# Patient Record
Sex: Male | Born: 1975
Health system: Southern US, Community
[De-identification: ages and names within clinical notes are randomized; demographics above are authoritative.]

## PROBLEM LIST (undated history)

## (undated) DIAGNOSIS — B159 Hepatitis A without hepatic coma: Secondary | ICD-10-CM

## (undated) DIAGNOSIS — E785 Hyperlipidemia, unspecified: Secondary | ICD-10-CM

## (undated) DIAGNOSIS — M549 Dorsalgia, unspecified: Secondary | ICD-10-CM

## (undated) HISTORY — DX: Hepatitis a without hepatic coma: B15.9

## (undated) HISTORY — DX: Hyperlipidemia, unspecified: E78.5

## (undated) HISTORY — DX: Dorsalgia, unspecified: M54.9

---
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
## 2008-12-17 ENCOUNTER — Emergency Department (HOSPITAL_COMMUNITY): Admission: EM | Admit: 2008-12-17 | Discharge: 2008-12-17 | Payer: Self-pay | Admitting: Emergency Medicine

## 2016-03-08 ENCOUNTER — Ambulatory Visit: Payer: Self-pay | Admitting: Family Medicine

## 2016-03-23 ENCOUNTER — Ambulatory Visit: Payer: Self-pay | Admitting: Family Medicine

## 2016-03-27 ENCOUNTER — Ambulatory Visit (INDEPENDENT_AMBULATORY_CARE_PROVIDER_SITE_OTHER): Payer: 59 | Admitting: Family Medicine

## 2016-03-27 ENCOUNTER — Encounter: Payer: Self-pay | Admitting: Family Medicine

## 2016-03-27 VITALS — BP 130/90 | HR 94 | Temp 98.1°F | Ht 75.0 in | Wt 225.0 lb

## 2016-03-27 DIAGNOSIS — Z0001 Encounter for general adult medical examination with abnormal findings: Secondary | ICD-10-CM

## 2016-03-27 DIAGNOSIS — R6889 Other general symptoms and signs: Secondary | ICD-10-CM | POA: Diagnosis not present

## 2016-03-27 DIAGNOSIS — M25561 Pain in right knee: Secondary | ICD-10-CM

## 2016-03-27 NOTE — Patient Instructions (Addendum)
We will call you within a week about your referral to Terrilee FilesZach [facility] of sports medicine. If you do not hear within 2 weeks, give us a call.   Keep an eye on blood pressure. Obviously goal <140/90. In office was 130/90. Weight loss should help- glad you are working on that.   Please send us a copy of labs from doctor day and if you can get a copy of immunizations from [facility] we can put those in the computer

## 2016-03-27 NOTE — Progress Notes (Signed)
Stephen Hunter, MD Phone: [phone]  Subjective:  Patient presents today tTana Jackson establish care. Chief complaint-noted.   See problem oriented charting  The following were reviewed and entered/updated in epic: Past Medical History  Diagnosis Date  . Hyperlipidemia     LDL low 100s  . Hepatitis A     age 89  . Back pain     Back pain about once a year- easy for a week or two then goes away. Since HS.    There are no active problems to display for this patient.  Past Surgical History  Procedure Laterality Date  . Appendectomy  1990    Family History  Problem Relation Age of Onset  . Prostate cancer Father     5572 treated radiation gleason 3+3  . Hyperlipidemia Father     no CAD in family  . Hypertension Mother   . Hypertension Mother   . Colon cancer Maternal Grandfather     age 991    Medications- reviewed and updated No current outpatient prescriptions on file.   No current facility-administered medications for this visit.    Allergies-reviewed and updated No Known Allergies  Social History   Social History  . Marital Status: Married    Spouse Name: N/A  . Number of Children: N/A  . Years of Education: N/A   Social History Main Topics  . Smoking status: Former Smoker -- 1.00 packs/day for 7 years    Types: Cigarettes    Quit date: 12/10/2004  . Smokeless tobacco: Not on file  . Alcohol Use: 1.2 - 1.8 oz/week    2-3 Standard drinks or equivalent per week  . Drug Use: No  . Sexual Activity: Not on file   Other Topics Concern  . Not on file   Social History Narrative   Family: Married to Jacobs EngineeringCristina Jackson. 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     ROS--Full ROS was completed Review of Systems  Constitutional: Negative for fever and chills.  HENT: Negative for hearing loss.   Eyes: Negative for blurred vision and double vision.  Respiratory: Negative for cough and hemoptysis.     Cardiovascular: Negative for chest pain and palpitations.  Gastrointestinal: Negative for heartburn and nausea.  Genitourinary: Negative for dysuria and urgency.  Musculoskeletal: Positive for back pain (at times) and joint pain (R knee).  Skin: Negative for itching and rash.  Neurological: Negative for dizziness, tingling and headaches.  Endo/Heme/Allergies: Negative for polydipsia. Does not bruise/bleed easily.  Psychiatric/Behavioral: Negative for hallucinations and substance abuse.  Back pain about once a year- easy for a week or two then goes away. Since HS.   Objective: BP 140/86 mmHg  Pulse 94  Temp(Src) 98.1 F (36.7 C)  Ht 6\' 3"  (1.905 m)  Wt 225 lb (102.059 kg)  BMI 28.12 kg/m2 Gen: NAD, resting comfortably HEENT: Mucous membranes are moist. Oropharynx normal. TM normal. Eyes: sclera and lids normal, PERRLA Neck: no thyromegaly, no cervical lymphadenopathy CV: RRR no murmurs rubs or gallops Lungs: CTAB no crackles, wheeze, rhonchi Abdomen: soft/nontender/nondistended/normal bowel sounds. No rebound or guarding.  Ext: no edema Skin: warm, dry Neuro: 5/5 strength in upper and lower extremities, normal gait, normal reflexes  Right Knee: Normal to inspection with no erythema or effusion or obvious bony abnormalities. Palpation normal with no warmth or joint line tenderness or patellar tenderness or condyle tenderness. ROM normal in flexion and extension  and lower leg rotation. Ligaments with solid consistent endpoints including ACL, PCL, LCL, MCL. Negative Mcmurray's - could produce pop at times without pain though Non painful patellar compression. Patellar and quadriceps tendons unremarkable. Hamstring and quadriceps strength is normal.   Assessment/Plan:  40 y.o. male presenting for annual physical.  Health Maintenance counseling: 1. Anticipatory guidance: Patient counseled regarding regular dental exams, eye exams, wearing seatbelts.  2. Risk factor reduction:   Advised patient of need for regular exercise and diet rich and fruits and vegetables to reduce risk of heart attack and stroke. Targeting weight of 200 3. Immunizations/screenings/ancillary studies- will get records from [facility] 4. Prostate cancer screening- father age 48, will start patient age 61  5. Colon cancer screening - start age 35, grandfather over 60 with history  Will drop off labs done at hospital on May 2nd for me to review  Will also monitor BP- slightly high diastolic  Right knee pain - Plan: Ambulatory referral to Sports Medicine For about 6 months has noted mild discomfort at times in right knee when goes to bed at night. Worse after he runs 4-5 hours. Usually runs on treadmill but enjoys being outdoors more. Knows he needs to lose weight to reduce stress. He has been as high as 255 but as low as 200 (from 2006-2010). Hears knee popping. He knows he likely has some underlying arthritis or damage- wants to discuss preventative measures with sports medicine to prevent progression to worsening issues. He is aware how weight loss is needed   Return in about 1 year (around 03/27/2017) for physical. Return precautions advised.   Orders Placed This Encounter  Procedures  . Ambulatory referral to Sports Medicine    Referral Priority:  Routine    Referral Type:  Consultation    Number of Visits Requested:  1

## 2016-04-10 ENCOUNTER — Encounter: Payer: Self-pay | Admitting: Family Medicine

## 2016-04-10 ENCOUNTER — Ambulatory Visit (INDEPENDENT_AMBULATORY_CARE_PROVIDER_SITE_OTHER): Payer: 59 | Admitting: Family Medicine

## 2016-04-10 ENCOUNTER — Other Ambulatory Visit: Payer: Self-pay

## 2016-04-10 VITALS — BP 118/82 | HR 75 | Ht 75.0 in | Wt 222.0 lb

## 2016-04-10 DIAGNOSIS — M25561 Pain in right knee: Secondary | ICD-10-CM

## 2016-04-10 DIAGNOSIS — M222X1 Patellofemoral disorders, right knee: Secondary | ICD-10-CM

## 2016-04-10 NOTE — Assessment & Plan Note (Signed)
Patient does have some patellofemoral syndrome. Muscle imbalances with weakness of the vastus medialis oblique. We discussed home exercises and patient did work with Event organiserathletic trainer today. We discussed icing regimen. We discussed which activities to do in which ones to avoid. We discussed the importance crosstraining. Patient will continue to monitor her shoes. Patient will try topical anti-inflammatories. We discussed which activities to do in which ones to avoid. Patient will follow-up with me again in 4 weeks for further evaluation and treatment.

## 2016-04-10 NOTE — Progress Notes (Signed)
Pre visit review using our clinic review tool, if applicable. No additional management support is needed unless otherwise documented below in the visit note. 

## 2016-04-10 NOTE — Progress Notes (Signed)
Tawana ScaleZach Smith D.O. [facility] [address] [place], [place] [address] Phone: [phone] Subjective:    I'm seeing this patient by the request  of:  Tana ConchStephen Hunter, MD  CC: Right knee pain  OZH:[id number]:Subjective James Otelia SergeantM Dulany is a 40 y.o. male coming in with complaint of right knee pain. Patient is had this pain for proximally 6 months. Seems to be worse when he goes to sleep at night. Patient is an avid runner and has noticed that it seems to be hurting him somewhat when he is running. Sometimes hears knee popping.Patient states that it is ever giving out on him or any instability. Denies any radiation down the leg or any numbness. States that he states really localize when it does occur. Not stopping him from any activities at this time and continues to be able to run miles at a time when needed. Has not tried any over-the-counter medications.     Past Medical History  Diagnosis Date  . Hyperlipidemia     LDL low 100s  . Hepatitis A     age 89  . Back pain     Back pain about once a year- easy for a week or two then goes away. Since HS.    Past Surgical History  Procedure Laterality Date  . Appendectomy  1990   Social History   Social History  . Marital Status: Married    Spouse Name: N/A  . Number of Children: N/A  . Years of Education: N/A   Social History Main Topics  . Smoking status: Former Smoker -- 1.00 packs/day for 7 years    Types: Cigarettes    Quit date: 12/10/2004  . Smokeless tobacco: None  . Alcohol Use: 1.2 - 1.8 oz/week    2-3 Standard drinks or equivalent per week  . Drug Use: No  . Sexual Activity: Not Asked   Other Topics Concern  . None   Social History Narrative   Family: Married to Jacobs EngineeringCristina Maynor. 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    No Known Allergies Family History  Problem Relation Age of Onset  . Prostate cancer Father     6772 treated radiation gleason  3+3  . Hyperlipidemia Father     no CAD in family  . Hypertension Mother   . Hypertension Mother   . Colon cancer Maternal Grandfather     age 41    Past medical history, social, surgical and family history all reviewed in electronic medical record.  No pertanent information unless stated regarding to the chief complaint.   Review of Systems: No headache, visual changes, nausea, vomiting, diarrhea, constipation, dizziness, abdominal pain, skin rash, fevers, chills, night sweats, weight loss, swollen lymph nodes, body aches, joint swelling, muscle aches, chest pain, shortness of breath, mood changes.   Objective Blood pressure 118/82, pulse 75, height 6\' 3"  (1.905 m), weight 222 lb (100.699 kg), SpO2 99 %.  General: No apparent distress alert and oriented x3 mood and affect normal, dressed appropriately.  HEENT: Pupils equal, extraocular movements intact  Respiratory: Patient's speak in full sentences and does not appear short of breath  Cardiovascular: No lower extremity edema, non tender, no erythema  Skin: Warm dry intact with no signs of infection or rash on extremities or on axial skeleton.  Abdomen: Soft nontender  Neuro: Cranial nerves II through XII are intact, neurovascularly intact in all extremities  with 2+ DTRs and 2+ pulses.  Lymph: No lymphadenopathy of posterior or anterior cervical chain or axillae bilaterally.  Gait normal with good balance and coordination.  MSK:  Non tender with full range of motion and good stability and symmetric strength and tone of shoulders, elbows, wrist, hip, and ankles bilaterally.  Knee:Right  normal to inspection with no erythema or effusion or obvious bony abnormalities. Palpation normal with no warmth, joint line tenderness, patellar tenderness, or condyle tenderness. ROM full in flexion and extension and lower leg rotation.Mild lateral tracking of the knee Compared to the contralateral side Ligaments with solid consistent endpoints  including ACL, PCL, LCL, MCL. Negative Mcmurray's, Apley's, and Thessalonian tests. Non painful patellar compression. Patellar glide with mild crepitus. Patellar and quadriceps tendons unremarkable. Hamstring and quadriceps strength is normal.  Contralateral knee unremarkable  MSK US performed of: Right knee This study was ordered, performed, and interpreted by Terrilee Files D.O.  Knee: All structures visualized. Anteromedial, anterolateral, posteromedial, and posterolateral menisci unremarkable without tearing, fraying, effusion, or displacement. Patellar Tendon unremarkable on long and transverse views without effusion.Very mild narrowing of the patellofemoral joint laterally No abnormality of prepatellar bursa. LCL and MCL unremarkable on long and transverse views. No abnormality of origin of medial or lateral head of the gastrocnemius.  IMPRESSION:  Minimal patellofemoral syndrome  Procedure note 97110; 15 minutes spent for Therapeutic exercises as stated in above notes.  This included exercises focusing on stretching, strengthening, with significant focus on eccentric aspects.  Vastus medialis oblique strengthening as well as eccentric exercises.  Conditioning to help with stabilization. Proper technique shown and discussed handout in great detail with ATC.  All questions were discussed and answered.     Impression and Recommendations:     This case required medical decision making of moderate complexity.      Note: This dictation was prepared with Dragon dictation along with smaller phrase technology. Any transcriptional errors that result from this process are unintentional.        l

## 2016-04-10 NOTE — Patient Instructions (Signed)
Good to meet you  Ice 20 minutes 2 times daily. Usually after activity and before bed. Exercises 3 times a week.  I would like you to cross train a bit and consider elliptical or biking instead of always running I would like you to add some leg weight lifting one time a week as well.  pennsaid pinkie amount topically 2 times daily as needed.  Vitamin D 2000 IU daily  Turmeric 500mg  1-2 times daily can help with inflammation  See me again in 4 weeks if not better or worse or send me a message in my chart  Keep up the weight loss, you ar doing great ..Marland Kitchen

## 2016-04-11 LAB — LIPID PANEL
Cholesterol: 205 mg/dL — AB (ref 0–200)
HDL: 38 mg/dL (ref 35–70)
LDL Cholesterol: 142 mg/dL
Triglycerides: 123 mg/dL (ref 40–160)

## 2016-04-11 LAB — BASIC METABOLIC PANEL
BUN: 9 mg/dL (ref 4–21)
Creatinine: 1 mg/dL (ref 0.6–1.3)
Glucose: 100 mg/dL
Potassium: 4.4 mmol/L (ref 3.4–5.3)
Sodium: 140 mmol/L (ref 137–147)

## 2016-04-11 LAB — HEPATIC FUNCTION PANEL
ALT: 19 U/L (ref 10–40)
AST: 24 U/L (ref 14–40)
Alkaline Phosphatase: 49 U/L (ref 25–125)
Bilirubin, Total: 0.6 mg/dL

## 2016-04-11 LAB — CBC AND DIFFERENTIAL
HCT: 42 % (ref 41–53)
Hemoglobin: 13.8 g/dL (ref 13.5–17.5)
Platelets: 228 10*3/uL (ref 150–399)
WBC: 6.2 10^3/mL

## 2016-04-11 LAB — PSA: PSA: 0.58

## 2016-04-11 LAB — TSH: TSH: 1.62 u[IU]/mL (ref 0.41–5.90)

## 2016-04-20 ENCOUNTER — Encounter: Payer: Self-pay | Admitting: Family Medicine

## 2016-04-20 ENCOUNTER — Other Ambulatory Visit: Payer: Self-pay | Admitting: Family Medicine

## 2016-05-08 ENCOUNTER — Encounter: Payer: Self-pay | Admitting: Family Medicine

## 2016-05-08 ENCOUNTER — Ambulatory Visit (INDEPENDENT_AMBULATORY_CARE_PROVIDER_SITE_OTHER): Payer: 59 | Admitting: Family Medicine

## 2016-05-08 VITALS — BP 122/82 | HR 62 | Ht 75.0 in | Wt 216.0 lb

## 2016-05-08 DIAGNOSIS — M222X1 Patellofemoral disorders, right knee: Secondary | ICD-10-CM

## 2016-05-08 NOTE — Assessment & Plan Note (Signed)
Doing significantly better at this time. We discussed icing regimen and home exercises. Patient will continue the conservative therapy and see me again in 6 weeks.

## 2016-05-08 NOTE — Progress Notes (Signed)
[facility] [address] [place], [place] [address] Phone: [phone] Subjective:    I'm seeing this patient by the request  of:  Tana Conch, MD  CC: Right knee pain f/u  QIH:[id number] Nolen Manolo Bosket is a 40 y.o. male coming in with complaint of right knee pain. Patient was sent have patellofemoral syndrome. Patient has continued to run and in doing the home exercises. Patient has been doing the icing. Did a trial of topical anti-inflammatories. Overall would state that he is about 90% better. States that the popping is a lot less. Still when he runs gives him some discomfort. Not icing on a regular basis. No nighttime awakenings.     Past Medical History  Diagnosis Date  . Hyperlipidemia     LDL low 100s  . Hepatitis A     age 35  . Back pain     Back pain about once a year- easy for a week or two then goes away. Since HS.    Past Surgical History  Procedure Laterality Date  . Appendectomy  1990   Social History   Social History  . Marital Status: Married    Spouse Name: N/A  . Number of Children: N/A  . Years of Education: N/A   Social History Main Topics  . Smoking status: Former Smoker -- 1.00 packs/day for 7 years    Types: Cigarettes    Quit date: 12/10/2004  . Smokeless tobacco: None  . Alcohol Use: 1.2 - 1.8 oz/week    2-3 Standard drinks or equivalent per week  . Drug Use: No  . Sexual Activity: Not Asked   Other Topics Concern  . None   Social History Narrative   Family: Married to [employer]. 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    No Known Allergies Family History  Problem Relation Age of Onset  . Prostate cancer Father     21 treated radiation gleason 3+3  . Hyperlipidemia Father     no CAD in family  . Hypertension Mother   . Hypertension Mother   . Colon cancer Maternal Grandfather     age 26    Past medical history,  social, surgical and family history all reviewed in electronic medical record.  No pertanent information unless stated regarding to the chief complaint.   Review of Systems: No headache, visual changes, nausea, vomiting, diarrhea, constipation, dizziness, abdominal pain, skin rash, fevers, chills, night sweats, weight loss, swollen lymph nodes, body aches, joint swelling, muscle aches, chest pain, shortness of breath, mood changes.   Objective Blood pressure 122/82, pulse 62, height  (1.905 m), weight 216 lb (97.977 kg), SpO2 99 %.  General: No apparent distress alert and oriented x3 mood and affect normal, dressed appropriately.  HEENT: Pupils equal, extraocular movements intact  Respiratory: Patient's speak in full sentences and does not appear short of breath  Cardiovascular: No lower extremity edema, non tender, no erythema  Skin: Warm dry intact with no signs of infection or rash on extremities or on axial skeleton.  Abdomen: Soft nontender  Neuro: Cranial nerves II through XII are intact, neurovascularly intact in all extremities with 2+ DTRs and 2+ pulses.  Lymph: No lymphadenopathy of posterior or anterior cervical chain or axillae bilaterally.  Gait normal with good balance and coordination.  MSK:  Non tender with full range of motion and good  stability and symmetric strength and tone of shoulders, elbows, wrist, hip, and ankles bilaterally.  Knee:Right  normal to inspection with no erythema or effusion or obvious bony abnormalities. Palpation normal with no warmth, joint line tenderness, patellar tenderness, or condyle tenderness. ROM full in flexion and extension and lower leg rotation.Mild improvement in lateral tracking still existed Ligaments with solid consistent endpoints including ACL, PCL, LCL, MCL. Negative Mcmurray's, Apley's, and Thessalonian tests. Non painful patellar compression. Patellar glide with no crepitus. Patellar and quadriceps tendons  unremarkable. Hamstring and quadriceps strength is normal.  Contralateral knee unremarkable       Impression and Recommendations:     This case required medical decision making of moderate complexity.      Note: This dictation was prepared with Dragon dictation along with smaller phrase technology. Any transcriptional errors that result from this process are unintentional.        l

## 2016-05-08 NOTE — Progress Notes (Signed)
Pre visit review using our clinic review tool, if applicable. No additional management support is needed unless otherwise documented below in the visit note. 

## 2017-04-04 ENCOUNTER — Encounter: Payer: Self-pay | Admitting: Family Medicine

## 2017-04-04 ENCOUNTER — Ambulatory Visit (INDEPENDENT_AMBULATORY_CARE_PROVIDER_SITE_OTHER): Payer: 59 | Admitting: Family Medicine

## 2017-04-04 VITALS — BP 138/88 | HR 89 | Temp 98.3°F | Ht 74.5 in | Wt 213.8 lb

## 2017-04-04 DIAGNOSIS — Z789 Other specified health status: Secondary | ICD-10-CM

## 2017-04-04 DIAGNOSIS — E7800 Pure hypercholesterolemia, unspecified: Secondary | ICD-10-CM

## 2017-04-04 DIAGNOSIS — Z0000 Encounter for general adult medical examination without abnormal findings: Secondary | ICD-10-CM

## 2017-04-04 DIAGNOSIS — R140 Abdominal distension (gaseous): Secondary | ICD-10-CM | POA: Diagnosis not present

## 2017-04-04 DIAGNOSIS — F40243 Fear of flying: Secondary | ICD-10-CM | POA: Diagnosis not present

## 2017-04-04 MED ORDER — LORAZEPAM 0.5 MG PO TABS: 0.5000 mg | ORAL_TABLET | Freq: Every day | ORAL | 0 refills | Status: DC | PRN

## 2017-04-04 NOTE — Assessment & Plan Note (Signed)
Hypnotherapy for flying but still gets anxious. [place] in June for 6 weeks. Ativan 0.5 mg written #20- precautions per avs

## 2017-04-04 NOTE — Progress Notes (Signed)
Pre visit review using our clinic review tool, if applicable. No additional management support is needed unless otherwise documented below in the visit note. 

## 2017-04-04 NOTE — Assessment & Plan Note (Addendum)
S: some issues since he was a kid. Some urgency if he was hiking. Switched to a plant based diet last November- last meat around the holidays. A lot of bloating since that time. Not having constipation or diarrhea with it. No abdominal cramping. With meat seems to resolve. Probiotics not helped. Raw vegan - symptoms resolved with this. Gluten free did not help. Worse during work week.  Symptom management- simethicone or imodium.  A/P: we reviewed FODMAP foods and potential irritants- unfortunately many of these needed for his plant base diet. May trial off groups individually to see if makes difference. Does not meet IBS criteria without abdominal pain/cramping and really diarrhea/constipation not an issue.

## 2017-04-04 NOTE — Progress Notes (Signed)
Phone: [phone]  Subjective:  Patient presents today for their annual physical. Chief complaint-noted.   See problem oriented charting- ROS- full  review of systems was completed and negative except for abdominal bloating- usually at work. No blood in stool or melena or constipation or diarrhea or vomiting  The following were reviewed and entered/updated in epic: Past Medical History:  Diagnosis Date  . Back pain    Back pain about once a year- easy for a week or two then goes away. Since HS.   Marland Kitchen Hepatitis A    age 52  . Hyperlipidemia    LDL low 100s   Patient Active Problem List   Diagnosis Date Noted  . Anxiety with flying 04/04/2017  . Bloating 04/04/2017  . Patellofemoral stress syndrome of right knee 04/10/2016   Past Surgical History:  Procedure Laterality Date  . APPENDECTOMY  1990   Family History  Problem Relation Age of Onset  . Prostate cancer Father     33 treated radiation gleason 3+3  . Hyperlipidemia Father     no CAD in family  . Hypertension Mother   . Colon cancer Maternal Grandfather     age 58    Medications- reviewed and updated Current Outpatient Prescriptions  Medication Sig Dispense Refill  . LORazepam (ATIVAN) 0.5 MG tablet Take 1-2 tablets (0.5-1 mg total) by mouth daily as needed for anxiety (for flights to [place]). 20 tablet 0   No current facility-administered medications for this visit.     Allergies-reviewed and updated No Known Allergies  Social History   Social History  . Marital status: Married    Spouse name: N/A  . Number of children: N/A  . Years of education: N/A   Social History Main Topics  . Smoking status: Former Smoker    Packs/day: 1.00    Years: 7.00    Types: Cigarettes    Quit date: 12/10/2004  . Smokeless tobacco: Never Used  . Alcohol use 1.2 - 1.8 oz/week    2 - 3 Standard drinks or equivalent per week  . Drug use: No  . Sexual activity: Yes   Other Topics Concern  . None   Social History  Narrative   Family: Married to [employer]. 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     Objective: BP 138/88 (BP Location: Left Arm, Patient Position: Sitting, Cuff Size: Large)   Pulse 89   Temp 98.3 F (36.8 C) (Oral)   Ht 6' 2.5" (1.892 m)   Wt 213 lb 12.8 oz (97 kg)   SpO2 97%   BMI 27.08 kg/m  Gen: NAD, resting comfortably HEENT: Mucous membranes are moist. Oropharynx normal Neck: no thyromegaly CV: RRR no murmurs rubs or gallops Lungs: CTAB no crackles, wheeze, rhonchi Abdomen: soft/nontender/nondistended/normal bowel sounds. No rebound or guarding.  Ext: no edema Skin: warm, dry Neuro: grossly normal, moves all extremities, PERRLA  Assessment/Plan:  41 y.o. male presenting for annual physical.  Health Maintenance counseling: 1. Anticipatory guidance: Patient counseled regarding regular dental exams q4 months, eye exams -started with readers, wearing seatbelts.  2. Risk factor reduction:  Advised patient of need for regular exercise and diet rich and fruits and vegetables to reduce risk of heart attack and stroke. Exercise- as able on off weeks. Diet- moving toward weight goal of 200 with plant based diet.  Wt Readings from Last 3 Encounters:  04/04/17 213 lb  12.8 oz (97 kg)  05/08/16 216 lb (98 kg)  04/10/16 222 lb (100.7 kg)  3. Immunizations/screenings/ancillary studies- have not received HR records yet. States due for Tdap but he states had it.  offered HIV screen as well- had in residency  4. Prostate cancer screening- still start at age 65 . Father with prostate cancer age 44 Lab Results  Component Value Date   PSA 0.58 04/11/2016   5. Colon cancer screening - start age 82- colon cancer in grandfather at age 50 6. Skin cancer screening/prevention- no concerns, uses long sleeves 7. Testicular cancer screening- advised monthly self exams  8. STD screening- patient opts out  Status of chronic  or acute concerns   Preventative health care - Plan: CBC, Comprehensive metabolic panel, Lipid panel, TSH, Vitamin B12, VITAMIN D 25 Hydroxy (Vit-D Deficiency, Fractures) Pure hypercholesterolemia - Plan: CBC, Comprehensive metabolic panel, Lipid panel, TSH Vegetarian diet - Plan: Vitamin B12, VITAMIN D 25 Hydroxy (Vit-D Deficiency, Fractures)  Declines PSA for now- likely start at 49 unless gets lab done on doctor day  BP at home 125/78 this AM. Diastolic runs high in office.   Anxiety with flying Hypnotherapy for flying but still gets anxious. [place] in June for 6 weeks. Ativan 0.5 mg written #20- precautions per avs  Bloating S: some issues since he was a kid. Some urgency if he was hiking. Switched to a plant based diet last November- last meat around the holidays. A lot of bloating since that time. Not having constipation or diarrhea with it. No abdominal cramping. With meat seems to resolve. Probiotics not helped. Raw vegan - symptoms resolved with this. Gluten free did not help. Worse during work week.  Symptom management- simethicone or imodium.  A/P: we reviewed FODMAP foods and potential irritants- unfortunately many of these needed for his plant base diet. May trial off groups individually to see if makes difference. Does not meet IBS criteria without abdominal pain/cramping and really diarrhea/constipation not an issue.   1 year CPE  Future fasting labs Orders Placed This Encounter  Procedures  . CBC    Standing Status:   Future    Standing Expiration Date:   04/04/2018  . Comprehensive metabolic panel    [place]    Standing Status:   Future    Standing Expiration Date:   04/04/2018  . Lipid panel    Standing Status:   Future    Standing Expiration Date:   04/04/2018  . TSH    Standing Status:   Future    Standing Expiration Date:   04/04/2018  . Vitamin B12    Standing Status:   Future    Standing Expiration Date:   04/04/2018  . VITAMIN D 25 Hydroxy (Vit-D Deficiency,  Fractures)    Standing Status:   Future    Standing Expiration Date:   04/04/2018    Meds ordered this encounter  Medications  . LORazepam (ATIVAN) 0.5 MG tablet    Sig: Take 1-2 tablets (0.5-1 mg total) by mouth daily as needed for anxiety (for flights to [place]).    Dispense:  20 tablet    Refill:  0   Return precautions advised.  Tana Conch, MD

## 2017-04-04 NOTE — Patient Instructions (Addendum)
Send me a copy of prior immunizations.   Schedule a lab visit at the check out desk within 2 weeks. Return for future fasting labs meaning nothing but water after midnight please. Ok to take your medications with water.   Ativan given for long plane flights. You are already aware not to use with alcohol and would also advise trial before your trip in evening to make sure you tolerate. Obviously dont drive for 8 hours after taking.   Please make sure Silvestre Mesi knows it was ultimately your decision not mine to not do the CRP!

## 2017-04-18 ENCOUNTER — Other Ambulatory Visit (INDEPENDENT_AMBULATORY_CARE_PROVIDER_SITE_OTHER): Payer: 59

## 2017-04-18 DIAGNOSIS — E7800 Pure hypercholesterolemia, unspecified: Secondary | ICD-10-CM

## 2017-04-18 DIAGNOSIS — Z789 Other specified health status: Secondary | ICD-10-CM | POA: Diagnosis not present

## 2017-04-18 DIAGNOSIS — Z0000 Encounter for general adult medical examination without abnormal findings: Secondary | ICD-10-CM

## 2017-04-18 LAB — COMPREHENSIVE METABOLIC PANEL
ALT: 13 U/L (ref 0–53)
AST: 18 U/L (ref 0–37)
Albumin: 4.5 g/dL (ref 3.5–5.2)
Alkaline Phosphatase: 55 U/L (ref 39–117)
BUN: 11 mg/dL (ref 6–23)
CO2: 30 mEq/L (ref 19–32)
Calcium: 9.6 mg/dL (ref 8.4–10.5)
Chloride: 105 mEq/L (ref 96–112)
Creatinine, Ser: 1 mg/dL (ref 0.40–1.50)
GFR: 87.24 mL/min (ref >60.00)
Glucose, Bld: 98 mg/dL (ref 70–99)
Potassium: 4.4 mEq/L (ref 3.5–5.1)
Sodium: 142 mEq/L (ref 135–145)
Total Bilirubin: 0.7 mg/dL (ref 0.2–1.2)
Total Protein: 6.9 g/dL (ref 6.0–8.3)

## 2017-04-18 LAB — CBC
HCT: 40.5 % (ref 39.0–52.0)
Hemoglobin: 13.8 g/dL (ref 13.0–17.0)
MCHC: 34 g/dL (ref 30.0–36.0)
MCV: 92.8 fl (ref 78.0–100.0)
Platelets: 177 10*3/uL (ref 150.0–400.0)
RBC: 4.36 Mil/uL (ref 4.22–5.81)
RDW: 13.2 % (ref 11.5–15.5)
WBC: 4.3 10*3/uL (ref 4.0–10.5)

## 2017-04-18 LAB — LIPID PANEL
Cholesterol: 156 mg/dL (ref 0–200)
HDL: 33.6 mg/dL — ABNORMAL LOW (ref >39.00)
LDL Cholesterol: 104 mg/dL — ABNORMAL HIGH (ref 0–99)
NonHDL: 122.51
Total CHOL/HDL Ratio: 5
Triglycerides: 91 mg/dL (ref 0.0–149.0)
VLDL: 18.2 mg/dL (ref 0.0–40.0)

## 2017-04-18 LAB — VITAMIN D 25 HYDROXY (VIT D DEFICIENCY, FRACTURES): VITD: 25.1 ng/mL — ABNORMAL LOW (ref 30.00–100.00)

## 2017-04-18 LAB — TSH: TSH: 1.05 u[IU]/mL (ref 0.35–4.50)

## 2017-04-18 LAB — VITAMIN B12: Vitamin B-12: 330 pg/mL (ref 211–911)

## 2018-03-05 DIAGNOSIS — E569 Vitamin deficiency, unspecified: Secondary | ICD-10-CM | POA: Diagnosis not present

## 2018-03-05 DIAGNOSIS — F439 Reaction to severe stress, unspecified: Secondary | ICD-10-CM | POA: Diagnosis not present

## 2018-03-05 DIAGNOSIS — E039 Hypothyroidism, unspecified: Secondary | ICD-10-CM | POA: Diagnosis not present

## 2018-03-05 DIAGNOSIS — Z131 Encounter for screening for diabetes mellitus: Secondary | ICD-10-CM | POA: Diagnosis not present

## 2018-03-05 DIAGNOSIS — K589 Irritable bowel syndrome without diarrhea: Secondary | ICD-10-CM | POA: Diagnosis not present

## 2018-03-05 DIAGNOSIS — N5089 Other specified disorders of the male genital organs: Secondary | ICD-10-CM | POA: Diagnosis not present

## 2018-03-05 DIAGNOSIS — E559 Vitamin D deficiency, unspecified: Secondary | ICD-10-CM | POA: Diagnosis not present

## 2018-03-05 DIAGNOSIS — F419 Anxiety disorder, unspecified: Secondary | ICD-10-CM | POA: Diagnosis not present

## 2018-03-05 DIAGNOSIS — R5383 Other fatigue: Secondary | ICD-10-CM | POA: Diagnosis not present

## 2018-03-06 LAB — PSA: PSA: 0.41

## 2018-03-06 LAB — BASIC METABOLIC PANEL
BUN: 13 (ref 4–21)
Creatinine: 0.8 (ref 0.6–1.3)
Glucose: 92
Potassium: 3.9 (ref 3.4–5.3)
Sodium: 140 (ref 137–147)

## 2018-03-06 LAB — CBC AND DIFFERENTIAL
HCT: 44 (ref 41–53)
Hemoglobin: 14.2 (ref 13.5–17.5)
Platelets: 186 (ref 150–399)
WBC: 5.2

## 2018-03-06 LAB — LIPID PANEL
Cholesterol: 167 (ref 0–200)
HDL: 36 (ref 35–70)
LDL Cholesterol: 116
Triglycerides: 76 (ref 40–160)

## 2018-03-06 LAB — HEPATIC FUNCTION PANEL
ALT: 16 (ref 10–40)
AST: 18 (ref 14–40)
Alkaline Phosphatase: 54 (ref 25–125)
Bilirubin, Total: 0.7

## 2018-03-06 LAB — TSH: TSH: 1.44 (ref 0.41–5.90)

## 2018-04-04 ENCOUNTER — Encounter: Payer: Self-pay | Admitting: Family Medicine

## 2018-04-04 ENCOUNTER — Other Ambulatory Visit: Payer: Self-pay

## 2018-06-26 ENCOUNTER — Ambulatory Visit (INDEPENDENT_AMBULATORY_CARE_PROVIDER_SITE_OTHER): Payer: 59 | Admitting: Family Medicine

## 2018-06-26 ENCOUNTER — Encounter: Payer: Self-pay | Admitting: Family Medicine

## 2018-06-26 VITALS — BP 122/68 | HR 94 | Temp 97.8°F | Ht 74.5 in | Wt 216.4 lb

## 2018-06-26 DIAGNOSIS — Z0000 Encounter for general adult medical examination without abnormal findings: Secondary | ICD-10-CM | POA: Diagnosis not present

## 2018-06-26 DIAGNOSIS — Z23 Encounter for immunization: Secondary | ICD-10-CM | POA: Diagnosis not present

## 2018-06-26 MED ORDER — LORAZEPAM 1 MG PO TABS: 1.0000 mg | ORAL_TABLET | Freq: Three times a day (TID) | ORAL | 0 refills | Status: DC

## 2018-06-26 NOTE — Patient Instructions (Addendum)
Health Maintenance Due  Topic Date Due  . TETANUS/TDAP - Today at office visit 08/14/2017   No changes today other than tuning up the diet

## 2018-06-26 NOTE — Progress Notes (Signed)
Phone: [phone]  Subjective:  Patient presents today for their annual physical. Chief complaint-noted.   See problem oriented charting- ROS- full  review of systems was completed and negative except for anxiety with flying  The following were reviewed and entered/updated in epic: Past Medical History:  Diagnosis Date  . Back pain    Back pain about once a year- easy for a week or two then goes away. Since HS.   Marland Kitchen Hepatitis A    age 42  . Hyperlipidemia    LDL low 100s   Patient Active Problem List   Diagnosis Date Noted  . Anxiety with flying 04/04/2017  . Bloating 04/04/2017  . Patellofemoral stress syndrome of right knee 04/10/2016   Past Surgical History:  Procedure Laterality Date  . APPENDECTOMY  1990    Family History  Problem Relation Age of Onset  . Prostate cancer Father        5 treated radiation gleason 3+3  . Hyperlipidemia Father        no CAD in family  . Hypertension Mother   . Colon cancer Maternal Grandfather        age 76    Medications- reviewed and updated No current outpatient medications on file.   No current facility-administered medications for this visit.     Allergies-reviewed and updated No Known Allergies  Social History   Social History Narrative   Family: Married to [employer]. 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     Objective: BP 122/68 (BP Location: Left Arm, Patient Position: Sitting, Cuff Size: Normal)   Pulse 94   Temp 97.8 F (36.6 C) (Oral)   Ht 6' 2.5" (1.892 m)   Wt 216 lb 6.4 oz (98.2 kg)   SpO2 99%   BMI 27.41 kg/m  Gen: NAD, resting comfortably HEENT: Mucous membranes are moist. Oropharynx normal Neck: no thyromegaly CV: RRR no murmurs rubs or gallops Lungs: CTAB no crackles, wheeze, rhonchi Abdomen: soft/nontender/nondistended/normal bowel sounds. No rebound or guarding. overweight Ext: no edema Skin: warm, dry Neuro:  grossly normal, moves all extremities, PERRLA  Assessment/Plan:  42 y.o. male presenting for annual physical.  Health Maintenance counseling: 1. Anticipatory guidance: Patient counseled regarding regular dental exams -q4 months, eye exams - just wearing readers, wearing seatbelts.  2. Risk factor reduction:  Advised patient of need for regular exercise and diet rich and fruits and vegetables to reduce risk of heart attack and stroke. Exercise- exercising regularly  5 days on weeks that he is on- not able to exercise on 7 days on. Diet-he would like to lose about 15 lbs- wants to cut down on "vegan junk foods" .  Wt Readings from Last 3 Encounters:  06/26/18 216 lb 6.4 oz (98.2 kg)  04/04/17 213 lb 12.8 oz (97 kg)  05/08/16 216 lb (98 kg)  3. Immunizations/screenings/ancillary studies- up to date Immunization History  Administered Date(s) Administered  . Influenza,inj,Quad PF,6+ Mos 09/02/2016  . MMR 06/02/2002  . Tdap 08/15/2007, 06/26/2018  4. Prostate cancer screening- low risk trend, being tested at doctors day . Dads prostate cancer in full remission.  Lab Results  Component Value Date   PSA 0.41 03/06/2018   PSA 0.58 04/11/2016   5. Colon cancer screening - colon cancer in grandfather at 26. no family history, start at age 49. He would prefer to wait until 49 after discussing with Gi specialist 6.  Skin cancer screening/prevention- focuses on long sleeves for coverage. Denies worrisome, changing, or new skin lesions.  7. Testicular cancer screening- advised monthly self exams  8. STD screening- patient opts out  Status of chronic or acute concerns  His wife is trying to get him to take vitamin D- he has been taking since winter 2000 units he thinks.   Bloating- small bowel intestinal overgrowth he has read about - he has gotten rid of onions and beans. He feels better overall  Anxiety with flying- going on 3 week trip to [place] for 20th anniversary- we will refill ativan at 1 mg.  The half mg dose had no effect.   Return in about 1 year (around 06/27/2019) for physical.  Lab/Order associations: Need for prophylactic vaccination with combined diphtheria-tetanus-pertussis (DTP) vaccine - Plan: Tdap vaccine greater than or equal to 7yo IM  Meds ordered this encounter  Medications  . LORazepam (ATIVAN) 1 MG tablet    Sig: Take 1 tablet (1 mg total) by mouth every 8 (eight) hours.    Dispense:  20 tablet    Refill:  0   Return precautions advised.  Garret Reddish, MD

## 2019-04-15 LAB — LIPID PANEL
Cholesterol: 198 (ref 0–200)
HDL: 40 (ref 35–70)
LDL Cholesterol: 123
LDl/HDL Ratio: 4.9
Triglycerides: 170 — AB (ref 40–160)

## 2019-04-15 LAB — BASIC METABOLIC PANEL
BUN: 10 (ref 4–21)
Creatinine: 1.1 (ref 0.6–1.3)

## 2019-04-15 LAB — HEPATIC FUNCTION PANEL: Alkaline Phosphatase: 53 (ref 25–125)

## 2019-04-15 LAB — HIV ANTIBODY (ROUTINE TESTING W REFLEX): HIV 1&2 Ab, 4th Generation: NONREACTIVE

## 2019-05-06 ENCOUNTER — Telehealth: Payer: Self-pay | Admitting: Family Medicine

## 2019-05-06 NOTE — Telephone Encounter (Signed)
I contacted the patient to schedule the physical. I advised as I was looking at the schedule that it would be into September before the next available physical slot. The patient mentioned that he is leaving for vacation to [place] July 22nd and needed it before then. I went ahead and scheduled the physical for July 20th due to circumstances and advised that I would notify the clinical team to make changes if needed. Please contact patient to advise if needed.    Copied from CRM [id number]. Topic: Appointment Scheduling - Scheduling Inquiry for Clinic >> May 05, 2019 12:08 PM Reggie Pile, [place] wrote: Reason for CRM: Patient calling in in need of annual physical and would like it in mid July. Call back is [phone] and advised to call tomorrow.

## 2019-05-28 ENCOUNTER — Telehealth: Payer: Self-pay | Admitting: Physical Therapy

## 2019-05-28 NOTE — Telephone Encounter (Signed)
Copied from [place] [id number]. Topic: General - Inquiry >> May 28, 2019 11:54 AM Mathis Bud wrote: Reason for CRM: patient is calling to send medical records to [place] [employer] [place] [employer].  Patient is redoing his life insurance.   Patient states Cleora Fleet did send forms last month.  [employer] received his wives but did not receive his, Patient does not know the fax number to send, but did state it should be the same fax as wives.  Patient call back  Call back # [phone]

## 2019-05-29 NOTE — Telephone Encounter (Signed)
Called pt and advised that he will need to contact medical records dept. Will send the number to him via MyChart as he is currently driving.

## 2019-06-29 ENCOUNTER — Encounter: Payer: Self-pay | Admitting: Family Medicine

## 2019-06-29 ENCOUNTER — Other Ambulatory Visit: Payer: Self-pay

## 2019-06-29 ENCOUNTER — Ambulatory Visit (INDEPENDENT_AMBULATORY_CARE_PROVIDER_SITE_OTHER): Payer: 59 | Admitting: Family Medicine

## 2019-06-29 VITALS — BP 130/70 | HR 69 | Temp 97.9°F | Ht 73.25 in | Wt 223.2 lb

## 2019-06-29 DIAGNOSIS — Z20822 Contact with and (suspected) exposure to covid-19: Secondary | ICD-10-CM

## 2019-06-29 DIAGNOSIS — Z125 Encounter for screening for malignant neoplasm of prostate: Secondary | ICD-10-CM | POA: Diagnosis not present

## 2019-06-29 DIAGNOSIS — E663 Overweight: Secondary | ICD-10-CM

## 2019-06-29 DIAGNOSIS — Z20828 Contact with and (suspected) exposure to other viral communicable diseases: Secondary | ICD-10-CM

## 2019-06-29 DIAGNOSIS — Z0000 Encounter for general adult medical examination without abnormal findings: Secondary | ICD-10-CM

## 2019-06-29 NOTE — Progress Notes (Signed)
Phone: [phone]   Subjective:  Patient presents today for their annual physical. Chief complaint-noted.   See problem oriented charting- ROS- full  review of systems was completed and negative including No chest pain or shortness of breath. No headache or blurry vision.   The following were reviewed and entered/updated in epic: Past Medical History:  Diagnosis Date  . Back pain    Back pain about once a year- easy for a week or two then goes away. Since HS.   Marland Kitchen Hepatitis A    age 72  . Hyperlipidemia    LDL low 100s   Patient Active Problem List   Diagnosis Date Noted  . Anxiety with flying 04/04/2017  . Bloating 04/04/2017  . Patellofemoral stress syndrome of right knee 04/10/2016   Past Surgical History:  Procedure Laterality Date  . APPENDECTOMY  1990    Family History  Problem Relation Age of Onset  . Prostate cancer Father        67 treated radiation gleason 3+3  . Hyperlipidemia Father        no CAD in family  . Hypertension Mother   . Colon cancer Maternal Grandfather        age 45    Medications- reviewed and updated Current Outpatient Medications  Medication Sig Dispense Refill  . LORazepam (ATIVAN) 1 MG tablet Take 1 tablet (1 mg total) by mouth every 8 (eight) hours. (Patient not taking: Reported on 06/29/2019) 20 tablet 0   No current facility-administered medications for this visit.     Allergies-reviewed and updated No Known Allergies  Social History   Social History Narrative   Family: Married to [employer]. 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    Objective  Objective:  BP 130/70   Pulse 69   Temp 97.9 F (36.6 C) (Oral)   Ht 6' 1.25" (1.861 m)   Wt 223 lb 3.2 oz (101.2 kg)   SpO2 98%   BMI 29.25 kg/m  Gen: NAD, resting comfortably HEENT: Mucous membranes are moist. Oropharynx normal. TM normal  Neck: no thyromegaly or cervical lymphadenopathy . CV: RRR  no murmurs rubs or gallops Lungs: CTAB no crackles, wheeze, rhonchi Abdomen: soft/nontender/nondistended/normal bowel sounds. No rebound or guarding.  Ext: no edema and 2+ PT pulses Skin: warm, dry Neuro: grossly normal, moves all extremities, PERRLA   Assessment and Plan  43 y.o. male presenting for annual physical.  Health Maintenance counseling: 1. Anticipatory guidance: Patient counseled regarding regular dental exams -q4 months, eye exams - just wears readers rarely,  avoiding smoking and second hand smoke , limiting alcohol to 2 beverages per day .   2. Risk factor reduction:  Advised patient of need for regular exercise and diet rich and fruits and vegetables to reduce risk of heart attack and stroke. Exercise- 5 days on weeks that he is not on- perhaps 1-2 days on week on. Diet-some weight gain from [place] last year and has had hard time trying to get it back off- lowest he has been at home was 216- has not been doing vegan foods as often. Not a big believer in calorie counting- he is going to try to go back to vegan foods .  Wt Readings from Last 3 Encounters:  06/29/19 223 lb 3.2 oz (101.2 kg)  06/26/18 216 lb 6.4 oz (98.2 kg)  04/04/17 213 lb 12.8 oz (97 kg)  3.  Immunizations/screenings/ancillary studies- up to date Immunization History  Administered Date(s) Administered  . Influenza,inj,Quad PF,6+ Mos 09/02/2016  . MMR 06/02/2002  . Tdap 08/15/2007, 06/26/2018  4. Prostate cancer screening-  defer PSA until next year with young age and low risk trend previously- dad did have prostate cancer - gone since 2016-so will monitor in future Lab Results  Component Value Date   PSA 0.41 03/06/2018   PSA 0.58 04/11/2016   5. Colon cancer screening - likely start age 10 - has chatted with Dr. Carlean Purl before about this decision.  6. Skin cancer screening- no dermatologist. advised regular sunscreen use- does UV resistant shirts. Denies worrisome, changing, or new skin lesions.  7. former  smoker- quit 2006 with 7 pack years. UA without blood with life insurance  Status of chronic or acute concerns    Patient is hospitalist at [employer] and has had several exposures as hospitalist- will test covid antibody  Less bloating with less vegan lately. Sparing imodium  Still doing vitamin D  Mild hyperlipidemia- risk as below. Stay off statin. Continue running. Work on [employer]- overweight per bmi. Go back toward plant based/vegan diet to help improve  The 10-year ASCVD risk score Mikey Bussing DC Brooke Bonito., et al., 2013) is: 2.2%*   Values used to calculate the score:     Age: 27 years     Sex: Male     Is Non-Hispanic African American: No     Diabetic: No     Tobacco smoker: No     Systolic Blood Pressure: 945 mmHg     Is BP treated: No     HDL Cholesterol: 40 mg/dL*     Total Cholesterol: 198 mg/dL*     * - Cholesterol units were assumed for this score calculation  Recommended follow up: 1 year CPE  Lab/Order associations: not fasting    ICD-10-CM   1. Screening for prostate cancer  Z12.5 PSA  2. Exposure to Covid-19 Virus  Z20.828 SAR CoV2 Serology (COVID 19)AB(IGG)IA   Return precautions advised.  Garret Reddish, MD

## 2019-06-29 NOTE — Patient Instructions (Addendum)
Please stop by lab before you go If you do not have mychart- we will call you about results within 5 business days of [place] receiving them.  If you have mychart- we will send your results within 3 business days of [place] receiving them.  If abnormal or we want to clarify a result, we will call or mychart you to make sure you receive the message.  If you have questions or concerns or don't hear within 5-7 days, please send [place] a message or call us.   No changes other than working on getting weight back down- sounds like going back to more plant based diet is the way to go for you

## 2019-06-30 LAB — SAR COV2 SEROLOGY (COVID19)AB(IGG),IA: SARS CoV2 AB IGG: NEGATIVE

## 2019-06-30 LAB — PSA: PSA: 0.39 ng/mL (ref 0.10–4.00)

## 2020-03-02 LAB — LIPID PANEL
Cholesterol: 172 (ref 0–200)
HDL: 34 — AB (ref 35–70)
LDL Cholesterol: 119
Triglycerides: 105 (ref 40–160)

## 2020-03-02 LAB — CBC AND DIFFERENTIAL
HCT: 34 — AB (ref 41–53)
Hemoglobin: 13 — AB (ref 13.5–17.5)
Neutrophils Absolute: 50
Platelets: 186 (ref 150–399)
WBC: 4.5

## 2020-03-02 LAB — HEPATIC FUNCTION PANEL
ALT: 11 (ref 10–40)
AST: 20 (ref 14–40)
Alkaline Phosphatase: 60 (ref 25–125)
Bilirubin, Total: 0.3

## 2020-03-02 LAB — COMPREHENSIVE METABOLIC PANEL
Albumin: 4.4 (ref 3.5–5.0)
Calcium: 9.5 (ref 8.7–10.7)
GFR calc Af Amer: 111
GFR calc non Af Amer: 96
Globulin: 2.5

## 2020-03-02 LAB — BASIC METABOLIC PANEL
BUN: 10 (ref 4–21)
Chloride: 103 (ref 99–108)
Creatinine: 1 (ref 0.6–1.3)
Glucose: 105
Potassium: 4.5 (ref 3.4–5.3)
Sodium: 139 (ref 137–147)

## 2020-03-02 LAB — HEMOGLOBIN A1C: Hemoglobin A1C: 5

## 2020-03-02 LAB — TSH: TSH: 1.74 (ref 0.41–5.90)

## 2020-03-02 LAB — CBC: RBC: 4.13 (ref 3.87–5.11)

## 2020-03-11 LAB — BASIC METABOLIC PANEL

## 2020-03-11 LAB — COMPREHENSIVE METABOLIC PANEL

## 2020-03-17 ENCOUNTER — Other Ambulatory Visit: Payer: Self-pay

## 2020-03-17 ENCOUNTER — Encounter: Payer: Self-pay | Admitting: Family Medicine

## 2020-03-19 ENCOUNTER — Encounter: Payer: Self-pay | Admitting: Family Medicine

## 2020-03-19 DIAGNOSIS — Z789 Other specified health status: Secondary | ICD-10-CM

## 2020-03-19 DIAGNOSIS — Z1321 Encounter for screening for nutritional disorder: Secondary | ICD-10-CM

## 2020-03-19 DIAGNOSIS — D649 Anemia, unspecified: Secondary | ICD-10-CM

## 2020-03-19 DIAGNOSIS — E785 Hyperlipidemia, unspecified: Secondary | ICD-10-CM

## 2020-03-21 MED ORDER — LORAZEPAM 1 MG PO TABS: 1.0000 mg | ORAL_TABLET | Freq: Three times a day (TID) | ORAL | 0 refills | Status: DC

## 2020-04-01 DIAGNOSIS — H524 Presbyopia: Secondary | ICD-10-CM | POA: Diagnosis not present

## 2020-05-09 ENCOUNTER — Telehealth: Payer: Self-pay | Admitting: Student in an Organized Health Care Education/Training Program

## 2020-06-01 NOTE — Telephone Encounter (Signed)
Sent in err

## 2020-06-11 DIAGNOSIS — Z20822 Contact with and (suspected) exposure to covid-19: Secondary | ICD-10-CM | POA: Diagnosis not present

## 2020-07-26 DIAGNOSIS — Z1159 Encounter for screening for other viral diseases: Secondary | ICD-10-CM | POA: Diagnosis not present

## 2020-08-03 ENCOUNTER — Other Ambulatory Visit: Payer: Self-pay | Admitting: Internal Medicine

## 2020-08-03 ENCOUNTER — Other Ambulatory Visit: Payer: Self-pay

## 2020-08-03 DIAGNOSIS — Z0000 Encounter for general adult medical examination without abnormal findings: Secondary | ICD-10-CM

## 2020-08-04 LAB — SARS-COV-2 ANTIBODY(IGG)SPIKE,SEMI-QUANTITATIVE: SARS COV1 AB(IGG)SPIKE,SEMI QN: 7.48 index — ABNORMAL HIGH (ref <1.00)

## 2020-10-25 ENCOUNTER — Ambulatory Visit (INDEPENDENT_AMBULATORY_CARE_PROVIDER_SITE_OTHER): Payer: 59

## 2020-10-25 ENCOUNTER — Encounter: Payer: Self-pay | Admitting: Family Medicine

## 2020-10-25 ENCOUNTER — Ambulatory Visit (INDEPENDENT_AMBULATORY_CARE_PROVIDER_SITE_OTHER): Payer: 59 | Admitting: Family Medicine

## 2020-10-25 ENCOUNTER — Ambulatory Visit: Payer: Self-pay

## 2020-10-25 ENCOUNTER — Other Ambulatory Visit: Payer: Self-pay

## 2020-10-25 VITALS — BP 130/82 | HR 64 | Ht 73.0 in | Wt 219.0 lb

## 2020-10-25 DIAGNOSIS — G8929 Other chronic pain: Secondary | ICD-10-CM

## 2020-10-25 DIAGNOSIS — M25561 Pain in right knee: Secondary | ICD-10-CM

## 2020-10-25 DIAGNOSIS — M23321 Other meniscus derangements, posterior horn of medial meniscus, right knee: Secondary | ICD-10-CM | POA: Diagnosis not present

## 2020-10-25 NOTE — Patient Instructions (Signed)
Good to see you Ice and compression sleeve with running Pennsaid for the knee Ok to stay active Avoid a lot of twisting except tennis See me again in 6 weeks

## 2020-10-25 NOTE — Assessment & Plan Note (Signed)
Patient is to avoid twisting motions.  X-rays pending.  Topical anti-inflammatories given.  Compression sleeve with patient also having a Baker's cyst mild overall follow-up again 6 weeks

## 2020-10-25 NOTE — Progress Notes (Signed)
[facility] [address] [place] [address] Phone: [phone] Subjective:   I James Jackson am serving as a Neurosurgeon for Dr. Antoine Primas.  This visit occurred during the SARS-CoV-2 public health emergency.  Safety protocols were in place, including screening questions prior to the visit, additional usage of staff PPE, and extensive cleaning of exam room while observing appropriate contact time as indicated for disinfecting solutions.   I'm seeing this patient by the request  of:  Shelva Majestic, MD  CC: right knee pain   [id number]   05/08/2016 Doing significantly better at this time. We discussed icing regimen and home exercises. Patient will continue the conservative therapy and see me again in 6 weeks.  Update 10/25/2020 James Jackson is a 44 y.o. male coming in with complaint of right knee pain. Patient states the pain is chronic. States that now he has more pain than anything. Pain after tennis. States he has been playing more tennis this year. Squatting is painful and worse after tennis. Pain is minimal with playing tennis. Medial deep pain. Knee pops at times. States he sometimes takes ibuprofen. Trying to stay active. 7/10 at its worse.        Past Medical History:  Diagnosis Date  . Back pain    Back pain about once a year- easy for a week or two then goes away. Since HS.   Marland Kitchen Hepatitis A    age 54  . Hyperlipidemia    LDL low 100s   Past Surgical History:  Procedure Laterality Date  . APPENDECTOMY  1990   Social History   Socioeconomic History  . Marital status: Married    Spouse name: Not on file  . Number of children: Not on file  . Years of education: Not on file  . Highest education level: Not on file  Occupational History  . Not on file  Tobacco Use  . Smoking status: Former Smoker    Packs/day: 1.00    Years: 7.00    Pack years: 7.00    Types: Cigarettes    Quit date: 12/10/2004    Years since  quitting: 15.8  . Smokeless tobacco: Never Used  Substance and Sexual Activity  . Alcohol use: Yes    Alcohol/week: 2.0 - 3.0 standard drinks    Types: 2 - 3 Standard drinks or equivalent per week  . Drug use: No  . Sexual activity: Yes  Other Topics Concern  . Not on file  Social History Narrative   Family: Married to James Jackson. 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    Social Determinants of Health   Financial Resource Strain:   . Difficulty of Paying Living Expenses: Not on file  Food Insecurity:   . Worried About Programme researcher, broadcasting/film/video in the Last Year: Not on file  . Ran Out of Food in the Last Year: Not on file  Transportation Needs:   . Lack of Transportation (Medical): Not on file  . Lack of Transportation (Non-Medical): Not on file  Physical Activity:   . Days of Exercise per Week: Not on file  . Minutes of Exercise per Session: Not on file  Stress:   . Feeling of Stress : Not on file  Social Connections:   . Frequency of Communication with Friends and Family: Not on file  . Frequency of Social Gatherings with  Friends and Family: Not on file  . Attends Religious Services: Not on file  . Active Member of Clubs or Organizations: Not on file  . Attends Banker Meetings: Not on file  . Marital Status: Not on file   No Known Allergies Family History  Problem Relation Age of Onset  . Prostate cancer Father        7 treated radiation gleason 3+3  . Hyperlipidemia Father        no CAD in family  . Hypertension Mother   . Colon cancer Maternal Grandfather        age 43         Current Outpatient Medications (Other):  Marland Kitchen  LORazepam (ATIVAN) 1 MG tablet, Take 1 tablet (1 mg total) by mouth every 8 (eight) hours.   Reviewed prior external information including notes and imaging from  primary care provider As well as notes that were available from care everywhere and other  healthcare systems.  Past medical history, social, surgical and family history all reviewed in electronic medical record.  No pertanent information unless stated regarding to the chief complaint.   Review of Systems:  No headache, visual changes, nausea, vomiting, diarrhea, constipation, dizziness, abdominal pain, skin rash, fevers, chills, night sweats, weight loss, swollen lymph nodes, body aches, joint swelling, chest pain, shortness of breath, mood changes. POSITIVE muscle aches  Objective  Blood pressure 130/82, pulse 64, height 6\' 1"  (1.854 m), weight 219 lb (99.3 kg), SpO2 97 %.   General: No apparent distress alert and oriented x3 mood and affect normal, dressed appropriately.  HEENT: Pupils equal, extraocular movements intact  Respiratory: Patient's speak in full sentences and does not appear short of breath  Cardiovascular: No lower extremity edema, non tender, no erythema  Neuro: Cranial nerves II through XII are intact, neurovascularly intact in all extremities with 2+ DTRs and 2+ pulses.  Gait normal with good balance and coordination.  MSK: Right knee exam mild positive McMurray's.  Near full range of motion.  Mild patellar grinding noted.  No significant instability of the knee.  Limited musculoskeletal ultrasound was performed and interpreted by  Limited ultrasound shows patient does have a little bit of displacement of the medial meniscus.  Seems to be more of a degenerative tear overall.  Patient does have a small Baker's cyst noted.  Mild narrowing of the medial joint space and mild narrowing of the patellofemoral joint.    Impression and Recommendations:     The above documentation has been reviewed and is accurate and complete Judi Saa, DO

## 2020-10-27 ENCOUNTER — Ambulatory Visit: Payer: 59 | Admitting: Family Medicine

## 2020-12-13 ENCOUNTER — Other Ambulatory Visit: Payer: Self-pay

## 2020-12-13 ENCOUNTER — Ambulatory Visit (INDEPENDENT_AMBULATORY_CARE_PROVIDER_SITE_OTHER): Payer: 59 | Admitting: Family Medicine

## 2020-12-13 ENCOUNTER — Ambulatory Visit: Payer: Self-pay

## 2020-12-13 ENCOUNTER — Encounter: Payer: Self-pay | Admitting: Family Medicine

## 2020-12-13 VITALS — BP 142/92 | HR 62 | Ht 73.0 in | Wt 219.0 lb

## 2020-12-13 DIAGNOSIS — M25561 Pain in right knee: Secondary | ICD-10-CM

## 2020-12-13 DIAGNOSIS — M23321 Other meniscus derangements, posterior horn of medial meniscus, right knee: Secondary | ICD-10-CM | POA: Diagnosis not present

## 2020-12-13 NOTE — Progress Notes (Signed)
[facility] [address] [place] [address] Phone: [phone] Subjective:   James Jackson, am serving as a scribe for Dr. Antoine Primas. This visit occurred during the SARS-CoV-2 public health emergency.  Safety protocols were in place, including screening questions prior to the visit, additional usage of staff PPE, and extensive cleaning of exam room while observing appropriate contact time as indicated for disinfecting solutions.   I'm seeing this patient by the request  of:  Shelva Majestic, MD  CC: Right knee pain follow-up  [id number]   10/25/2020 Patient is to avoid twisting motions.  X-rays pending.  Topical anti-inflammatories given.  Compression sleeve with patient also having a Baker's cyst mild overall follow-up again 6 weeks   Update 12/14/2019 Nasir Nicklous Aburto is a 45 y.o. male coming in with complaint of right knee pain. States that his had increase in pain last week after playing tennis. Unable to go up stairs. Pain over medial aspect of knee. Came to a hard stop during a game but does not feel like he had another injury. Was on elliptical yesterday and had pain after activity. Did noticed some calf pain after the hard stop during the game. No longer had calf pain.      Patient did have x-rays taken November 16 there were independently visualized by me showing very minimal osteoarthritic changes otherwise completely unremarkable Ultrasound at last visit did show the patient does have mild displacement of the medial meniscus with a small Baker's cyst noted.  Past Medical History:  Diagnosis Date  . Back pain    Back pain about once a year- easy for a week or two then goes away. Since HS.   Marland Kitchen Hepatitis A    age 33  . Hyperlipidemia    LDL low 100s   Past Surgical History:  Procedure Laterality Date  . APPENDECTOMY  1990   Social History   Socioeconomic History  . Marital status: Married    Spouse name: Not on file   . Number of children: Not on file  . Years of education: Not on file  . Highest education level: Not on file  Occupational History  . Not on file  Tobacco Use  . Smoking status: Former Smoker    Packs/day: 1.00    Years: 7.00    Pack years: 7.00    Types: Cigarettes    Quit date: 12/10/2004    Years since quitting: 16.0  . Smokeless tobacco: Never Used  Substance and Sexual Activity  . Alcohol use: Yes    Alcohol/week: 2.0 - 3.0 standard drinks    Types: 2 - 3 Standard drinks or equivalent per week  . Drug use: No  . Sexual activity: Yes  Other Topics Concern  . Not on file  Social History Narrative   Family: Married to [employer]. 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    Social Determinants of Health   Financial Resource Strain: Not on file  Food Insecurity: Not on file  Transportation Needs: Not on file  Physical Activity: Not on file  Stress: Not on file  Social Connections: Not on file   No Known Allergies Family History  Problem Relation Age of Onset  . Prostate cancer Father        72 treated radiation gleason 3+3  . Hyperlipidemia Father  no CAD in family  . Hypertension Mother   . Colon cancer Maternal Grandfather        age 15         Current Outpatient Medications (Other):  Marland Kitchen  LORazepam (ATIVAN) 1 MG tablet, Take 1 tablet (1 mg total) by mouth every 8 (eight) hours.   Reviewed prior external information including notes and imaging from  primary care provider As well as notes that were available from care everywhere and other healthcare systems.  Past medical history, social, surgical and family history all reviewed in electronic medical record.  No pertanent information unless stated regarding to the chief complaint.   Review of Systems:  No headache, visual changes, nausea, vomiting, diarrhea, constipation, dizziness, abdominal pain, skin rash, fevers, chills,  night sweats, weight loss, swollen lymph nodes, body aches, joint swelling, chest pain, shortness of breath, mood changes. POSITIVE muscle aches  Objective  Blood pressure (!) 142/92, pulse 62, height 6\' 1"  (1.854 m), weight 219 lb (99.3 kg), SpO2 98 %.   General: No apparent distress alert and oriented x3 mood and affect normal, dressed appropriately.  HEENT: Pupils equal, extraocular movements intact  Respiratory: Patient's speak in full sentences and does not appear short of breath  Cardiovascular: No lower extremity edema, non tender, no erythema  Patient's right knee still has a positive McMurray's.  Tender to palpation in the medial joint space.  Patient does have still fullness noted of the popliteal region compared to the contralateral side.  Limited musculoskeletal ultrasound was performed and interpreted by  Limited ultrasound of patient's right knee shows the patient's meniscus does not appear to be as displaced but degenerative tearing still noted mostly of the posterior horn even though patient is having pain more on the anterior aspect of the knee patient also has a enlargement of the Baker's cyst noted. Impression: Questionable interval improvement of medial meniscal tear with enlargement of the Baker's cyst    Impression and Recommendations:     The above documentation has been reviewed and is accurate and complete Judi Saa, DO

## 2020-12-13 NOTE — Assessment & Plan Note (Signed)
Patient still has the Baker's cyst noted.  Patient's mild displacement is noted of the medial meniscus though appears to be somewhat better.  Patient will and does not want to have any aspiration done today.  Discussed the potential for aspiration at a later date.  Discussed compression icing regimen with topical anti-inflammatories or oral anti-inflammatories for short burst.  Follow-up with me again in 1 to 2 months

## 2020-12-13 NOTE — Patient Instructions (Signed)
Meniscus is better Cyst is bigger Run or elliptical in stragiht line 3 IBU, 3x a day for 3 days See me in 6 weeks for recheck

## 2020-12-19 ENCOUNTER — Other Ambulatory Visit: Payer: Self-pay

## 2020-12-19 DIAGNOSIS — M25561 Pain in right knee: Secondary | ICD-10-CM

## 2021-01-04 ENCOUNTER — Ambulatory Visit
Admission: RE | Admit: 2021-01-04 | Discharge: 2021-01-04 | Disposition: A | Discharge status: Home / Self Care | Payer: 59 | Source: Ambulatory Visit | Attending: Family Medicine | Admitting: Family Medicine

## 2021-01-04 DIAGNOSIS — M25561 Pain in right knee: Secondary | ICD-10-CM

## 2021-01-04 NOTE — Patient Instructions (Addendum)
We will call you within two weeks about your referral for coronary calcium scoring. If you do not hear within 2 weeks, give [place] a call.   Lets do some home monitoring and if you could send me a message and let me know how home #s are doing. In office 120/90 but suspect diastolic lower at home  Health Maintenance Due  Topic Date Due  . COLONOSCOPY- think about who you want to use and then give them a call. If need referral let me know.  Never done   Recommended follow up: Return in about 1 year (around 01/05/2022) for physical or sooner if needed.

## 2021-01-04 NOTE — Progress Notes (Signed)
Phone: [phone]    Subjective:  Patient presents today for their annual physical. Chief complaint-noted.   See problem oriented charting- ROS- full  review of systems was completed and negative  except for: right knee pain working with Dr. Tamala Julian- MRI pending  The following were reviewed and entered/updated in epic: Past Medical History:  Diagnosis Date  . Back pain    Back pain about once a year- easy for a week or two then goes away. Since HS.   Marland Kitchen Hepatitis A    age 4  . Hyperlipidemia    LDL low 100s   Patient Active Problem List   Diagnosis Date Noted  . Medial meniscus, posterior horn derangement, right 10/25/2020  . Anxiety with flying 04/04/2017  . Bloating 04/04/2017  . Patellofemoral stress syndrome of right knee 04/10/2016   Past Surgical History:  Procedure Laterality Date  . APPENDECTOMY  1990    Family History  Problem Relation Age of Onset  . Prostate cancer Father        43 treated radiation gleason 3+3  . Hyperlipidemia Father        no CAD in family  . Hypertension Mother   . Colon cancer Maternal Grandfather        age 58    Medications- reviewed and updated Current Outpatient Medications  Medication Sig Dispense Refill  . LORazepam (ATIVAN) 1 MG tablet Take 1 tablet (1 mg total) by mouth every 8 (eight) hours. 20 tablet 0   No current facility-administered medications for this visit.    Allergies-reviewed and updated No Known Allergies  Social History   Social History Narrative   Family: Married to [employer]. 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       Objective:  BP (!) 142/92   Pulse 63   Temp (!) 97.5 F (36.4 C) (Temporal)   Ht 6' 1"  (1.854 m)   Wt 222 lb 12.8 oz (101.1 kg)   SpO2 98%   BMI 29.39 kg/m  Gen: NAD, resting comfortably HEENT: Mucous membranes are moist. Oropharynx normal Neck: no thyromegaly CV: RRR no murmurs rubs or  gallops Lungs: CTAB no crackles, wheeze, rhonchi Abdomen: soft/nontender/nondistended/normal bowel sounds. No rebound or guarding.  Ext: no edema Skin: warm, dry Neuro: grossly normal, moves all extremities, PERRLA     Assessment and Plan:  45 y.o. male presenting for annual physical.  Health Maintenance counseling: 1. Anticipatory guidance: Patient counseled regarding regular dental exams -q4 months, eye exams- only wears readers - saw Adriana Reams and was told prn follow up,  avoiding smoking and second hand smoke , limiting alcohol to 2 beverages per day- 2 per week, no drugs.   2. Risk factor reduction:  Advised patient of need for regular exercise and diet rich and fruits and vegetables to reduce risk of heart attack and stroke. Exercise- injury dec 28 (slid on clay in tennis- working with Dr. Tamala Julian)- took 2 weeks lower body break now doing elliptical average 4 days on week off . Diet-down 7 lbs in last month- mostly vegan and helpful- plus work schedule makes it hard to eat- but does have access to some unhealthier quicker foods at hospital. Maintained weight over last 16 months- he wants to get BMI down to 25.  Wt Readings from Last 3 Encounters:  01/05/21 222 lb 12.8 oz (101.1 kg)  12/13/20 219 lb (99.3 kg)  10/25/20  219 lb (99.3 kg)  3. Immunizations/screenings/ancillary studies- up to date . Discussed hcv screen- had at work- he will call back if finds out did not Immunization History  Administered Date(s) Administered  . Influenza,inj,Quad PF,6+ Mos 09/02/2016  . Influenza-Unspecified 09/09/2020  . MMR 06/02/2002  . PFIZER(Purple Top)SARS-COV-2 Vaccination 12/17/2018, 11/27/2019, 09/01/2020  . Tdap 08/15/2007, 06/26/2018  4. Prostate cancer screening- through labs with [facility]- he will get this year and we will review  Lab Results  Component Value Date   PSA 0.39 06/29/2019   PSA 0.41 03/06/2018   PSA 0.58 04/11/2016   5. Colon cancer screening - maternal grandfather  with colon cancer late in life. We discussed newer guidelines about doing colonoscopy at 26 (but coerage unclear)- he is going to call to schedule after he thinks about who he will see- leaning toward Dr. Benson Norway 6. Skin cancer screening/prevention- no dermatologist. advised regular sunscreen use- tries to cover up. Denies worrisome, changing, or new skin lesions.  7. Testicular cancer screening- advised monthly self exams  8. STD screening- patient opts out as monogomous 9. former smoker-   Status of chronic or acute concerns   #hyperlipidemia S: Medication:none  Lab Results  Component Value Date   CHOL 172 03/02/2020   HDL 34 (A) 03/02/2020   LDLCALC 119 03/02/2020   TRIG 105 03/02/2020   CHOLHDL 5 04/18/2017   A/P: 10 year ascvd risk at 3%- he would like to pursue coronary calcium scoring to be aggressive about prevention. Will order today  #elevated bp reading BP Readings from Last 3 Encounters:  01/05/21 (!) 142/92  12/13/20 (!) 142/92  10/25/20 130/82  A/P: Lets do some home monitoring and if you could send me a message and let me know how home #s are doing. In office 120/90 but suspect diastolic lower at home  Recommended follow up: Return in about 1 year (around 01/05/2022) for physical or sooner if needed. Future Appointments  Date Time Provider [place]  01/24/2021  9:45 AM Lyndal Pulley, DO [facility] None   Lab/Order associations: fasting- no labs today   ICD-10-CM   1. Preventative health care  Z00.00   2. Encounter for hepatitis C screening test for low risk patient  Z11.59   3. Hyperlipidemia, unspecified hyperlipidemia type  E78.5     No orders of the defined types were placed in this encounter.   Return precautions advised.   Garret Reddish, MD

## 2021-01-05 ENCOUNTER — Encounter: Payer: Self-pay | Admitting: Family Medicine

## 2021-01-05 ENCOUNTER — Other Ambulatory Visit: Payer: Self-pay

## 2021-01-05 ENCOUNTER — Ambulatory Visit (INDEPENDENT_AMBULATORY_CARE_PROVIDER_SITE_OTHER): Payer: 59 | Admitting: Family Medicine

## 2021-01-05 VITALS — BP 120/90 | HR 63 | Temp 97.5°F | Ht 73.0 in | Wt 222.8 lb

## 2021-01-05 DIAGNOSIS — Z0000 Encounter for general adult medical examination without abnormal findings: Secondary | ICD-10-CM

## 2021-01-05 DIAGNOSIS — Z1211 Encounter for screening for malignant neoplasm of colon: Secondary | ICD-10-CM

## 2021-01-05 DIAGNOSIS — E785 Hyperlipidemia, unspecified: Secondary | ICD-10-CM | POA: Diagnosis not present

## 2021-01-05 DIAGNOSIS — Z1159 Encounter for screening for other viral diseases: Secondary | ICD-10-CM

## 2021-01-06 ENCOUNTER — Encounter: Payer: Self-pay | Admitting: Family Medicine

## 2021-01-11 NOTE — Progress Notes (Signed)
[facility] [address] [place] [address] Phone: [phone] Subjective:   I [name] am serving as a Neurosurgeon for Dr. Antoine Primas.  This visit occurred during the SARS-CoV-2 public health emergency.  Safety protocols were in place, including screening questions prior to the visit, additional usage of staff PPE, and extensive cleaning of exam room while observing appropriate contact time as indicated for disinfecting solutions.   I'm seeing this patient by the request  of:  Shelva Majestic, MD  CC: Right knee pain  [id number]   12/13/2020 Patient still has the Baker's cyst noted.  Patient's mild displacement is noted of the medial meniscus though appears to be somewhat better.  Patient will and does not want to have any aspiration done today.  Discussed the potential for aspiration at a later date.  Discussed compression icing regimen with topical anti-inflammatories or oral anti-inflammatories for short burst.  Follow-up with me again in 1 to 2 months  Update 01/12/2021 Gustav Bolton Canupp is a 45 y.o. male coming in with complaint of right knee pain. States he played tennis recently and he is back at square one. Stairs are difficult.   MRI right knee 01/04/2021 IMPRESSION: 1. Very small free edge tear in the posterior horn of the medial meniscus. 2. Mild focal synovitis just above the patella and behind the quadriceps tendon. 3. Baker's cyst with mild semimembranosus-tibial collateral ligament bursitis. 4. Minimal chondral heterogeneity along the medial patellar facet.     Past Medical History:  Diagnosis Date  . Back pain    Back pain about once a year- easy for a week or two then goes away. Since HS.   Marland Kitchen Hepatitis A    age 60  . Hyperlipidemia    LDL low 100s   Past Surgical History:  Procedure Laterality Date  . APPENDECTOMY  1990   Social History   Socioeconomic History  . Marital status: Married    Spouse name:  Not on file  . Number of children: Not on file  . Years of education: Not on file  . Highest education level: Not on file  Occupational History  . Not on file  Tobacco Use  . Smoking status: Former Smoker    Packs/day: 1.00    Years: 7.00    Pack years: 7.00    Types: Cigarettes    Quit date: 12/10/2004    Years since quitting: 16.1  . Smokeless tobacco: Never Used  Substance and Sexual Activity  . Alcohol use: Yes    Alcohol/week: 2.0 - 3.0 standard drinks    Types: 2 - 3 Standard drinks or equivalent per week  . Drug use: No  . Sexual activity: Yes  Other Topics Concern  . Not on file  Social History Narrative   Family: Married to [employer]. 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    Social Determinants of Health   Financial Resource Strain: Not on file  Food Insecurity: Not on file  Transportation Needs: Not on file  Physical Activity: Not on file  Stress: Not on file  Social Connections: Not on file   No Known Allergies Family History  Problem Relation Age of Onset  . Prostate cancer Father        10 treated radiation gleason 3+3  . Hyperlipidemia Father        no CAD in family  .  Hypertension Mother   . Colon cancer Maternal Grandfather        age 61         Current Outpatient Medications (Other):  Marland Kitchen  LORazepam (ATIVAN) 1 MG tablet, Take 1 tablet (1 mg total) by mouth every 8 (eight) hours.   Reviewed prior external information including notes and imaging from  primary care provider As well as notes that were available from care everywhere and other healthcare systems.  Past medical history, social, surgical and family history all reviewed in electronic medical record.  No pertanent information unless stated regarding to the chief complaint.   Review of Systems:  No headache, visual changes, nausea, vomiting, diarrhea, constipation, dizziness, abdominal pain, skin rash, fevers,  chills, night sweats, weight loss, swollen lymph nodes, body aches, joint swelling, chest pain, shortness of breath, mood changes. POSITIVE muscle aches  Objective  Blood pressure 140/90, pulse (!) 59, height 6\' 1"  (1.854 m), weight 228 lb (103.4 kg), SpO2 99 %.   General: No apparent distress alert and oriented x3 mood and affect normal, dressed appropriately.  HEENT: Pupils equal, extraocular movements intact  Respiratory: Patient's speak in full sentences and does not appear short of breath  Cardiovascular: No lower extremity edema, non tender, no erythema  Gait normal with good balance and coordination.  MSK: Right knee exam does show the patient does have a positive meniscal signs still noted.  Minorly tender on the anterior aspect of the knee still.  Mild tightness of the hamstring.  Near full range of motion of the knee.  After informed written and verbal consent, patient was seated on exam table. Right knee was prepped with alcohol swab and utilizing anterolateral approach, patient's right knee space was injected with 4:1  marcaine 0.5%: Kenalog 40mg /dL. Patient tolerated the procedure well without immediate complications.    Impression and Recommendations:     The above documentation has been reviewed and is accurate and complete , DO

## 2021-01-12 ENCOUNTER — Ambulatory Visit (INDEPENDENT_AMBULATORY_CARE_PROVIDER_SITE_OTHER): Payer: 59 | Admitting: Family Medicine

## 2021-01-12 ENCOUNTER — Other Ambulatory Visit: Payer: Self-pay

## 2021-01-12 ENCOUNTER — Ambulatory Visit: Payer: Self-pay

## 2021-01-12 ENCOUNTER — Encounter: Payer: Self-pay | Admitting: Family Medicine

## 2021-01-12 VITALS — BP 140/90 | HR 59 | Ht 73.0 in | Wt 228.0 lb

## 2021-01-12 DIAGNOSIS — M23321 Other meniscus derangements, posterior horn of medial meniscus, right knee: Secondary | ICD-10-CM | POA: Diagnosis not present

## 2021-01-12 DIAGNOSIS — M25561 Pain in right knee: Secondary | ICD-10-CM | POA: Diagnosis not present

## 2021-01-12 DIAGNOSIS — G8929 Other chronic pain: Secondary | ICD-10-CM | POA: Diagnosis not present

## 2021-01-12 NOTE — Patient Instructions (Addendum)
Injected knee today Potentially move appt to 4-6 weeks Thigh compression sleeve with tennis

## 2021-01-12 NOTE — Assessment & Plan Note (Signed)
Chronic problem with mild exacerbation.  Patient's MRI consistent with the findings on the ultrasound.  Patient does have a Baker's cyst as well as some mild tendinitis.  Discussed other conservative therapy but given injection today because patient has not made significant progress.  Discussed home exercises and icing regimen.  Discussed follow-up with me again in 6 weeks

## 2021-01-24 ENCOUNTER — Ambulatory Visit: Payer: 59 | Admitting: Family Medicine

## 2021-02-01 ENCOUNTER — Other Ambulatory Visit: Payer: Self-pay

## 2021-02-01 ENCOUNTER — Ambulatory Visit (INDEPENDENT_AMBULATORY_CARE_PROVIDER_SITE_OTHER)
Admission: RE | Admit: 2021-02-01 | Discharge: 2021-02-01 | Disposition: A | Discharge status: Home / Self Care | Payer: Self-pay | Source: Ambulatory Visit | Attending: Family Medicine | Admitting: Family Medicine

## 2021-02-01 DIAGNOSIS — E785 Hyperlipidemia, unspecified: Secondary | ICD-10-CM

## 2021-02-20 NOTE — Progress Notes (Deleted)
[facility] [address] [place] [address] Phone: [phone] Subjective:    I'm seeing this patient by the request  of:  Shelva Majestic, MD  CC:   [id number]   01/12/2021 Chronic problem with mild exacerbation.  Patient's MRI consistent with the findings on the ultrasound.  Patient does have a Baker's cyst as well as some mild tendinitis.  Discussed other conservative therapy but given injection today because patient has not made significant progress.  Discussed home exercises and icing regimen.  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 Patient states   O     Past Medical History:  Diagnosis Date  . Back pain    Back pain about once a year- easy for a week or two then goes away. Since HS.   Marland Kitchen Hepatitis A    age 81  . Hyperlipidemia    LDL low 100s   Past Surgical History:  Procedure Laterality Date  . APPENDECTOMY  1990   Social History   Socioeconomic History  . Marital status: Married    Spouse name: Not on file  . Number of children: Not on file  . Years of education: Not on file  . Highest education level: Not on file  Occupational History  . Not on file  Tobacco Use  . Smoking status: Former Smoker    Packs/day: 1.00    Years: 7.00    Pack years: 7.00    Types: Cigarettes    Quit date: 12/10/2004    Years since quitting: 16.2  . Smokeless tobacco: Never Used  Substance and Sexual Activity  . Alcohol use: Yes    Alcohol/week: 2.0 - 3.0 standard drinks    Types: 2 - 3 Standard drinks or equivalent per week  . Drug use: No  . Sexual activity: Yes  Other Topics Concern  . Not on file  Social History Narrative   Family: Married to [employer]. 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    Social Determinants of Health   Financial  Resource Strain: Not on file  Food Insecurity: Not on file  Transportation Needs: Not on file  Physical Activity: Not on file  Stress: Not on file  Social Connections: Not on file   No Known Allergies Family History  Problem Relation Age of Onset  . Prostate cancer Father        26 treated radiation gleason 3+3  . Hyperlipidemia Father        no CAD in family  . Hypertension Mother   . Colon cancer Maternal Grandfather        age 78         Current Outpatient Medications (Other):  Marland Kitchen  LORazepam (ATIVAN) 1 MG tablet, Take 1 tablet (1 mg total) by mouth every 8 (eight) hours.   Reviewed prior external information including notes and imaging from  primary care provider As well as notes that were available from care everywhere and other healthcare systems.  Past medical history, social, surgical and family history all reviewed in electronic medical record.  No pertanent information unless stated regarding to the chief complaint.   Review of Systems:  No headache, visual changes, nausea, vomiting, diarrhea, constipation, dizziness, abdominal pain, skin rash, fevers, chills, night sweats,  weight loss, swollen lymph nodes, body aches, joint swelling, chest pain, shortness of breath, mood changes. POSITIVE muscle aches  Objective  There were no vitals taken for this visit.   General: No apparent distress alert and oriented x3 mood and affect normal, dressed appropriately.  HEENT: Pupils equal, extraocular movements intact  Respiratory: Patient's speak in full sentences and does not appear short of breath  Cardiovascular: No lower extremity edema, non tender, no erythema  Gait normal with good balance and coordination.  MSK:  Non tender with full range of motion and good stability and symmetric strength and tone of shoulders, elbows, wrist, hip, knee and ankles bilaterally.     Impression and Recommendations:     The above documentation has been reviewed and is accurate and  complete Wilford Grist

## 2021-02-21 ENCOUNTER — Ambulatory Visit: Payer: 59 | Admitting: Family Medicine

## 2021-03-15 ENCOUNTER — Encounter: Payer: Self-pay | Admitting: Infectious Diseases

## 2021-03-15 LAB — LIPID PANEL
Cholesterol: 194 (ref 0–200)
HDL: 36 (ref 35–70)
LDL Cholesterol: 139
Triglycerides: 106 (ref 40–160)

## 2021-03-15 LAB — HEPATIC FUNCTION PANEL
ALT: 13 (ref 10–40)
AST: 18 (ref 14–40)
Alkaline Phosphatase: 59 (ref 25–125)
Bilirubin, Total: 0.5

## 2021-03-15 LAB — TSH: TSH: 2.99 (ref 0.41–5.90)

## 2021-03-15 LAB — BASIC METABOLIC PANEL
BUN: 11 (ref 4–21)
CO2: 23 — AB (ref 13–22)
Chloride: 103 (ref 99–108)
Creatinine: 0.9 (ref 0.6–1.3)
Glucose: 101
Potassium: 4.6 (ref 3.4–5.3)
Sodium: 141 (ref 137–147)

## 2021-03-15 LAB — CBC AND DIFFERENTIAL
HCT: 40 — AB (ref 41–53)
Hemoglobin: 13.9 (ref 13.5–17.5)
Neutrophils Absolute: 2.6
Platelets: 192 (ref 150–399)
WBC: 5.3

## 2021-03-15 LAB — COMPREHENSIVE METABOLIC PANEL
Albumin: 4.6 (ref 3.5–5.0)
Calcium: 9.6 (ref 8.7–10.7)
Globulin: 2.1

## 2021-03-15 LAB — CBC: RBC: 4.34 (ref 3.87–5.11)

## 2021-03-15 LAB — HEMOGLOBIN A1C: Hemoglobin A1C: 5.1

## 2021-03-15 LAB — PSA: PSA: 0.5

## 2021-04-18 ENCOUNTER — Encounter: Payer: Self-pay | Admitting: Family Medicine

## 2021-06-07 ENCOUNTER — Encounter: Payer: Self-pay | Admitting: Family Medicine

## 2021-06-07 ENCOUNTER — Other Ambulatory Visit (HOSPITAL_COMMUNITY): Payer: Self-pay

## 2021-06-07 ENCOUNTER — Other Ambulatory Visit: Payer: Self-pay

## 2021-06-07 MED ORDER — LORAZEPAM 1 MG PO TABS
1.0000 mg | ORAL_TABLET | Freq: Three times a day (TID) | ORAL | 0 refills | Status: DC | PRN
  Filled 2021-06-07: qty 20, 6d supply, fill #0

## 2021-06-07 NOTE — Progress Notes (Signed)
I refilled this 

## 2021-12-26 ENCOUNTER — Encounter: Payer: Self-pay | Admitting: Family Medicine

## 2021-12-26 DIAGNOSIS — Z1211 Encounter for screening for malignant neoplasm of colon: Secondary | ICD-10-CM

## 2022-01-07 DIAGNOSIS — E785 Hyperlipidemia, unspecified: Secondary | ICD-10-CM | POA: Insufficient documentation

## 2022-01-07 NOTE — Progress Notes (Signed)
Phone: [phone]    Subjective:  Patient presents today for their annual physical. Chief complaint-noted.   See problem oriented charting- ROS- full  review of systems was completed and negative  except for: painful wart on foot, joint pain with   The following were reviewed and entered/updated in epic: Past Medical History:  Diagnosis Date   Back pain    Back pain about once a year- easy for a week or two then goes away. Since HS.    Hepatitis A    age 53   Hyperlipidemia    LDL low 100s   Patient Active Problem List   Diagnosis Date Noted   Hyperlipidemia 01/07/2022    Priority: Medium    Anxiety with flying 04/04/2017    Priority: Medium    Medial meniscus, posterior horn derangement, right 10/25/2020    Priority: Low   Patellofemoral stress syndrome of right knee 04/10/2016    Priority: Low   Bloating 04/04/2017   Past Surgical History:  Procedure Laterality Date   APPENDECTOMY  1990    Family History  Problem Relation Age of Onset   Prostate cancer Father        58 treated radiation gleason 3+3   Hyperlipidemia Father        no CAD in family   Hypertension Mother    Colon cancer Maternal Grandfather        age 8    Medications- reviewed and updated Current Outpatient Medications  Medication Sig Dispense Refill   LORazepam (ATIVAN) 1 MG tablet Take 1 tablet by mouth every 8 (eight) hours as needed for anxiety (for flights). 20 tablet 0   No current facility-administered medications for this visit.    Allergies-reviewed and updated No Known Allergies  Social History   Social History Narrative   Family: Married to [employer]. 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       Objective:  BP (!) 140/92    Pulse 61    Temp 98 F (36.7 C)    Ht 6' 1" (1.854 m)    Wt 229 lb 9.6 oz (104.1 kg)    SpO2 98%    BMI 30.29 kg/m  Gen: NAD, resting comfortably HEENT: Mucous membranes  are moist. Oropharynx normal Neck: no thyromegaly CV: RRR no murmurs rubs or gallops Lungs: CTAB no crackles, wheeze, rhonchi Abdomen: soft/nontender/nondistended/normal bowel sounds. No rebound or guarding.  Ext: no edema Skin: warm, dry Neuro: grossly normal, moves all extremities, PERRLA    Assessment and Plan:  46 y.o. male presenting for annual physical.  Health Maintenance counseling: 1. Anticipatory guidance: Patient counseled regarding regular dental exams -q4 months, eye exams-readers only-has seen Dr. Brigitte Pulse who said as needed follow-up only,  avoiding smoking and second hand smoke, limiting alcohol to 2 beverages per day-2 a week typically, no illicit drugs.   2. Risk factor reduction:  Advised patient of need for regular exercise and diet rich and fruits and vegetables to reduce risk of heart attack and stroke.  Exercise-last year doing elliptical 4 days a week after tennis injury-currently started back at tennis- 2 days a week and elliptical and/or weight lifting 3 days a week.  Diet/weight management-work schedule can make this challenging.  Mostly vegan in the past.  Currently-struggling with snacking at hospital- weight up 7 lbs in last year. .  -Peak weight in recent years 228- just above  that now Wt Readings from Last 3 Encounters:  01/08/22 229 lb 9.6 oz (104.1 kg)  01/12/21 228 lb (103.4 kg)  01/05/21 222 lb 12.8 oz (101.1 kg)  3. Immunizations/screenings/ancillary studies-bivalent booster for COVID discussed- he will send [place] dates. Immunization History  Administered Date(s) Administered   Influenza,inj,Quad PF,6+ Mos 09/02/2016, 09/25/2021   Influenza-Unspecified 09/09/2020   MMR 06/02/2002   PFIZER(Purple Top)SARS-COV-2 Vaccination 12/17/2018, 11/27/2019, 09/01/2020   Tdap 08/15/2007, 06/26/2018  4. Prostate cancer screening- typically gets labs through Dr.'s day.  Father did have prostate cancer in his 45s.  PSA was 0.5 on 03/15/2021- we will check again with labs-  will come back Lab Results  Component Value Date   PSA 0.5 03/15/2021   PSA 0.39 06/29/2019   PSA 0.41 03/06/2018   5. Colon cancer screening -last year patient was planning on seeing Dr. Benson Norway for colonoscopy but wanted to verify insurance coverage first-today he reports scheduled in February [place] visit.  Maternal grandfather with colon cancer but in his 17s 6. Skin cancer screening/prevention-no dermatologist.  Low risk due to melanin content. advised regular sunscreen use- covers up mainly. Denies worrisome, changing, or new skin lesions.  7. Testicular cancer screening-discussed monthly self exams  8. STD screening- patient opts out as monogamous 9. Smoking associated screening-former smoker- quit smoking in 2006 and only 7 pack years-offered UA- wants to hold off   Status of chronic or acute concerns   #hyperlipidemia-CT cardiac scoring of 0 on February 01, 2021 #Aortic atherosclerosis-from CT cardiac scoring "Scant calcifications in ascending aorta." S: Medication:None Lab Results  Component Value Date   CHOL 194 03/15/2021   HDL 36 03/15/2021   LDLCALC 139 03/15/2021   TRIG 106 03/15/2021   CHOLHDL 5 04/18/2017  A/P: will get lipoprotein A per his request with labs for more info- for now hold off on statin and work on lifestyle   #Ascending Aorta:-From CT cardiac scoring "mildly dilated at the bifurcation of the main pulmonary artery at 27m."  -We discussed using caution with using quinolones in the future  -We discussed options for follow-up- monitor without imaging for now- perhaps recheck if we do CT cardiac scoring   #Elevated blood pressure readings-we have encouraged home monitoring in prior years- will trial again- slightly high in office but has done much better at home. Auto read cuff.   -at home 120s/130s over 80s- most recently 134/73. Will monitor at home and let me know-   #Anxiety with flying-patient has done hypnotherapy in the past with some relief.  Still  requires Ativan for  R[place]is and Futunatrips- plus going to DR in 2 months- will refill   #Sugar 101 on labs for last year but A1c was normal at 5.1 . Average at home was 5.2 on 14 day monitor  #wart on right foot. Actually painful. Salt soaks. Compound without relief. Prefers compound W. Too painful for duct tape.   Recommended follow up: Return in about 1 year (around 01/08/2023) for physical or sooner if needed.  Lab/Order associations: fasting   ICD-10-CM   1. Preventative health care  Z00.00 CBC with Differential/Platelet    Comprehensive metabolic panel    Lipid panel    Lipoprotein A (LPA)    PSA    TSH    Vitamin B12    VITAMIN D 25 Hydroxy (Vit-D Deficiency, Fractures)    2. Hyperlipidemia, unspecified hyperlipidemia type  E78.5 CBC with Differential/Platelet    Comprehensive metabolic panel    Lipid panel  Lipoprotein A (LPA)    TSH    3. Screening for prostate cancer  Z12.5 PSA    4. Vitamin B12 deficiency  E53.8 Vitamin B12    5. Vitamin D deficiency  E55.9 VITAMIN D 25 Hydroxy (Vit-D Deficiency, Fractures)     Meds ordered this encounter  Medications   LORazepam (ATIVAN) 1 MG tablet    Sig: Take 1 tablet by mouth every 8 (eight) hours as needed for anxiety (for flights).    Dispense:  20 tablet    Refill:  0   Return precautions advised.  Garret Reddish, MD

## 2022-01-08 ENCOUNTER — Ambulatory Visit (INDEPENDENT_AMBULATORY_CARE_PROVIDER_SITE_OTHER): Payer: 59 | Admitting: Family Medicine

## 2022-01-08 ENCOUNTER — Other Ambulatory Visit: Payer: Self-pay

## 2022-01-08 ENCOUNTER — Encounter: Payer: Self-pay | Admitting: Family Medicine

## 2022-01-08 ENCOUNTER — Other Ambulatory Visit (HOSPITAL_COMMUNITY): Payer: Self-pay

## 2022-01-08 VITALS — BP 140/92 | HR 61 | Temp 98.0°F | Ht 73.0 in | Wt 229.6 lb

## 2022-01-08 DIAGNOSIS — Z125 Encounter for screening for malignant neoplasm of prostate: Secondary | ICD-10-CM | POA: Diagnosis not present

## 2022-01-08 DIAGNOSIS — Z0000 Encounter for general adult medical examination without abnormal findings: Secondary | ICD-10-CM | POA: Diagnosis not present

## 2022-01-08 DIAGNOSIS — E538 Deficiency of other specified B group vitamins: Secondary | ICD-10-CM

## 2022-01-08 DIAGNOSIS — E559 Vitamin D deficiency, unspecified: Secondary | ICD-10-CM

## 2022-01-08 DIAGNOSIS — E785 Hyperlipidemia, unspecified: Secondary | ICD-10-CM | POA: Diagnosis not present

## 2022-01-08 MED ORDER — LORAZEPAM 1 MG PO TABS
1.0000 mg | ORAL_TABLET | Freq: Three times a day (TID) | ORAL | 0 refills | Status: DC | PRN
  Filled 2022-01-08: qty 20, 7d supply, fill #0

## 2022-01-08 NOTE — Patient Instructions (Signed)
Has colonoscopy appointment in 2 weeks.  Please have them send [place] the results  Will mychart [place] dates of booster for COVID  Schedule a lab visit at the check out desk within 2 weeks. Return for future fasting labs meaning nothing but water after midnight please. Ok to take your medications with water.   Recommended follow up: Return in about 1 year (around 01/08/2023) for physical or sooner if needed.

## 2022-01-19 ENCOUNTER — Other Ambulatory Visit: Payer: 59

## 2022-01-22 ENCOUNTER — Other Ambulatory Visit: Payer: 59

## 2022-01-22 DIAGNOSIS — Z1211 Encounter for screening for malignant neoplasm of colon: Secondary | ICD-10-CM | POA: Diagnosis not present

## 2022-01-30 ENCOUNTER — Other Ambulatory Visit (HOSPITAL_COMMUNITY): Payer: Self-pay

## 2022-01-30 ENCOUNTER — Encounter: Payer: Self-pay | Admitting: Family Medicine

## 2022-01-30 MED ORDER — NIRMATRELVIR/RITONAVIR (PAXLOVID)TABLET
3.0000 | ORAL_TABLET | Freq: Two times a day (BID) | ORAL | 0 refills | Status: AC
  Filled 2022-01-30: qty 30, 5d supply, fill #0

## 2022-01-31 ENCOUNTER — Other Ambulatory Visit: Payer: 59

## 2022-02-08 ENCOUNTER — Other Ambulatory Visit (HOSPITAL_COMMUNITY): Payer: Self-pay

## 2022-02-09 ENCOUNTER — Other Ambulatory Visit (HOSPITAL_COMMUNITY): Payer: Self-pay

## 2022-02-09 MED ORDER — CLENPIQ 10-3.5-12 MG-GM -GM/160ML PO SOLN
ORAL | 0 refills | Status: DC
  Filled 2022-02-09: qty 320, 1d supply, fill #0

## 2022-02-10 ENCOUNTER — Other Ambulatory Visit (HOSPITAL_COMMUNITY): Payer: Self-pay

## 2022-02-12 ENCOUNTER — Other Ambulatory Visit (INDEPENDENT_AMBULATORY_CARE_PROVIDER_SITE_OTHER): Payer: 59

## 2022-02-12 ENCOUNTER — Other Ambulatory Visit: Payer: Self-pay

## 2022-02-12 DIAGNOSIS — Z0000 Encounter for general adult medical examination without abnormal findings: Secondary | ICD-10-CM | POA: Diagnosis not present

## 2022-02-12 DIAGNOSIS — Z125 Encounter for screening for malignant neoplasm of prostate: Secondary | ICD-10-CM

## 2022-02-12 DIAGNOSIS — E559 Vitamin D deficiency, unspecified: Secondary | ICD-10-CM

## 2022-02-12 DIAGNOSIS — E785 Hyperlipidemia, unspecified: Secondary | ICD-10-CM | POA: Diagnosis not present

## 2022-02-12 DIAGNOSIS — E538 Deficiency of other specified B group vitamins: Secondary | ICD-10-CM | POA: Diagnosis not present

## 2022-02-12 LAB — CBC WITH DIFFERENTIAL/PLATELET
Basophils Absolute: 0 10*3/uL (ref 0.0–0.1)
Basophils Relative: 0.4 % (ref 0.0–3.0)
Eosinophils Absolute: 0.1 10*3/uL (ref 0.0–0.7)
Eosinophils Relative: 1.6 % (ref 0.0–5.0)
HCT: 38.2 % — ABNORMAL LOW (ref 39.0–52.0)
Hemoglobin: 12.9 g/dL — ABNORMAL LOW (ref 13.0–17.0)
Lymphocytes Relative: 32.1 % (ref 12.0–46.0)
Lymphs Abs: 1.5 10*3/uL (ref 0.7–4.0)
MCHC: 33.7 g/dL (ref 30.0–36.0)
MCV: 92.6 fl (ref 78.0–100.0)
Monocytes Absolute: 0.4 10*3/uL (ref 0.1–1.0)
Monocytes Relative: 8.8 % (ref 3.0–12.0)
Neutro Abs: 2.8 10*3/uL (ref 1.4–7.7)
Neutrophils Relative %: 57.1 % (ref 43.0–77.0)
Platelets: 232 10*3/uL (ref 150.0–400.0)
RBC: 4.13 Mil/uL — ABNORMAL LOW (ref 4.22–5.81)
RDW: 13.1 % (ref 11.5–15.5)
WBC: 4.8 10*3/uL (ref 4.0–10.5)

## 2022-02-12 LAB — COMPREHENSIVE METABOLIC PANEL
ALT: 13 U/L (ref 0–53)
AST: 16 U/L (ref 0–37)
Albumin: 4.5 g/dL (ref 3.5–5.2)
Alkaline Phosphatase: 52 U/L (ref 39–117)
BUN: 10 mg/dL (ref 6–23)
CO2: 29 mEq/L (ref 19–32)
Calcium: 9.6 mg/dL (ref 8.4–10.5)
Chloride: 102 mEq/L (ref 96–112)
Creatinine, Ser: 0.86 mg/dL (ref 0.40–1.50)
GFR: 103.87 mL/min (ref >60.00)
Glucose, Bld: 97 mg/dL (ref 70–99)
Potassium: 4.1 mEq/L (ref 3.5–5.1)
Sodium: 139 mEq/L (ref 135–145)
Total Bilirubin: 0.6 mg/dL (ref 0.2–1.2)
Total Protein: 7 g/dL (ref 6.0–8.3)

## 2022-02-12 LAB — LIPID PANEL
Cholesterol: 198 mg/dL (ref 0–200)
HDL: 40 mg/dL (ref >39.00)
LDL Cholesterol: 138 mg/dL — ABNORMAL HIGH (ref 0–99)
NonHDL: 157.99
Total CHOL/HDL Ratio: 5
Triglycerides: 98 mg/dL (ref 0.0–149.0)
VLDL: 19.6 mg/dL (ref 0.0–40.0)

## 2022-02-12 LAB — VITAMIN D 25 HYDROXY (VIT D DEFICIENCY, FRACTURES): VITD: 34.9 ng/mL (ref 30.00–100.00)

## 2022-02-12 LAB — VITAMIN B12: Vitamin B-12: 416 pg/mL (ref 211–911)

## 2022-02-12 LAB — TSH: TSH: 1.82 u[IU]/mL (ref 0.35–5.50)

## 2022-02-12 LAB — PSA: PSA: 0.21 ng/mL (ref 0.10–4.00)

## 2022-02-15 LAB — LIPOPROTEIN A (LPA): Lipoprotein (a): 13 nmol/L (ref <75)

## 2022-02-21 IMAGING — MR MR KNEE*R* W/O CM
4 of 7 series · 19 of 40 positions shown · non-contrast
Comparison: Radiographs 10/25/2020

CLINICAL DATA: Medial right knee pain, tennis injury 5 months ago

EXAM:
MRI OF THE RIGHT KNEE WITHOUT CONTRAST
TECHNIQUE: Multiplanar, multisequence MR imaging of the knee was performed. No
intravenous contrast was administered.

[Series 3: T2 fat-sat · axial · 4.0mm · 0.29mm/px · z∈[-99,+3]mm · 4 of 29 slices shown]
[im 1/29]
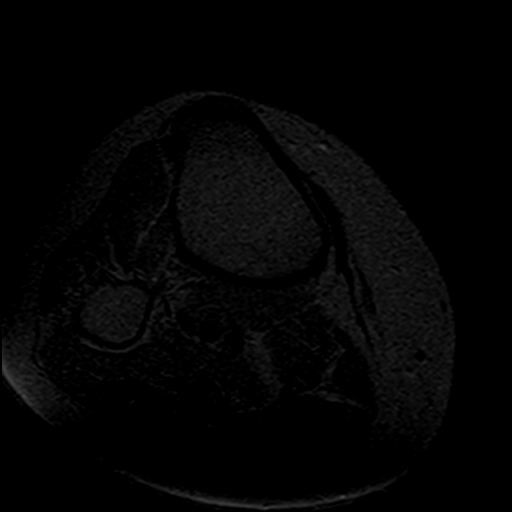
[im 5/29]
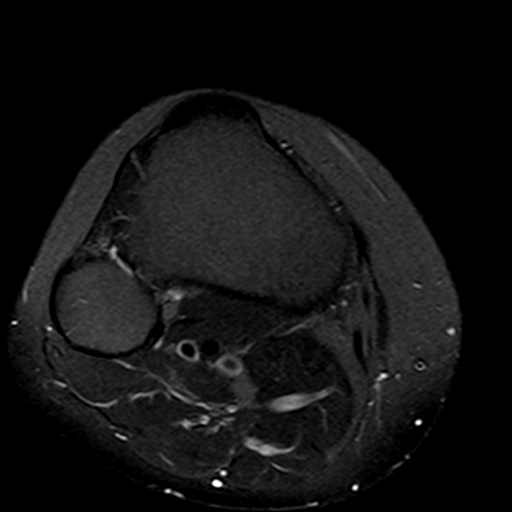
[im 15/29]
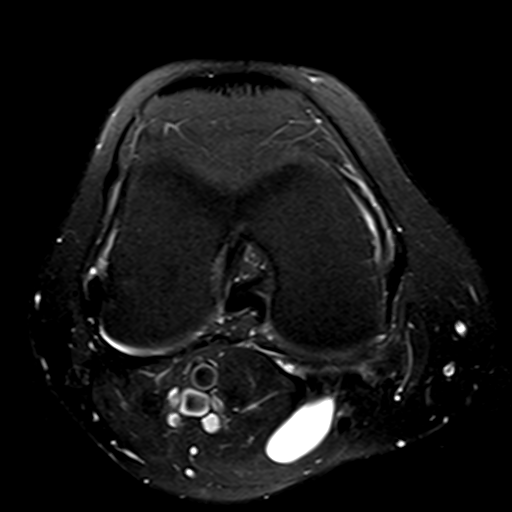
[im 24/29]
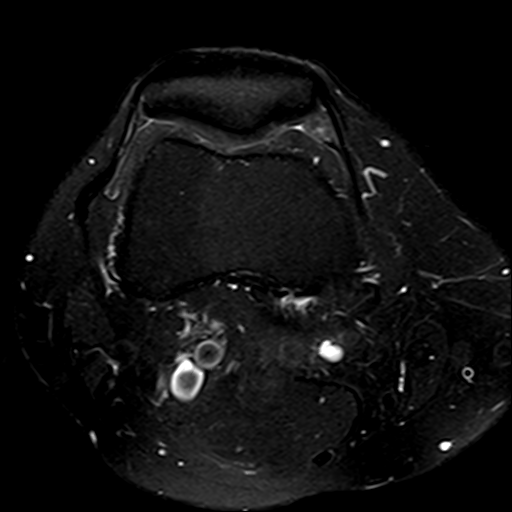

[Series 6: PD fat-sat · coronal · 3.0mm · 0.29mm/px · 7 of 32 slices shown (1 of 3)]
[im 1/32]
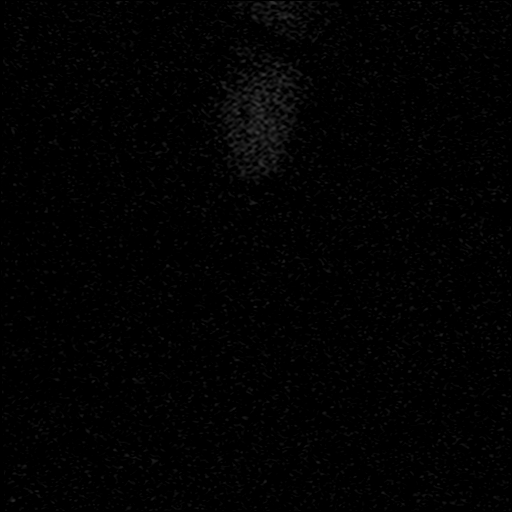
[im 6/32]
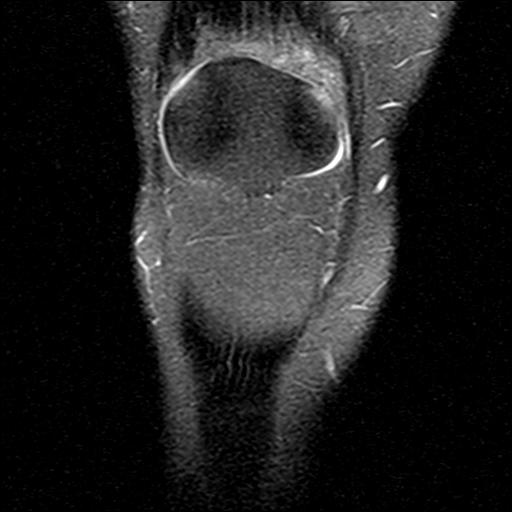
[im 11/32]
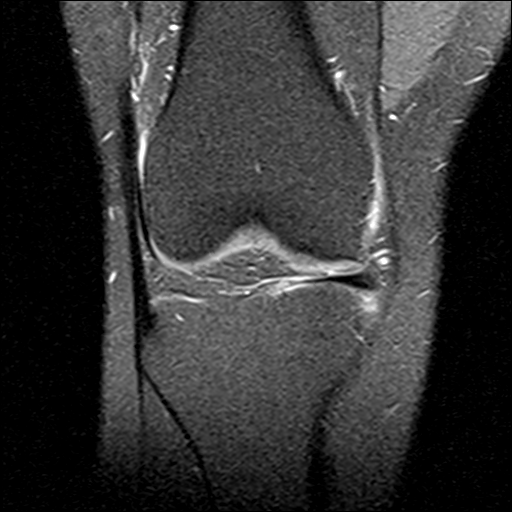
[im 16/32]
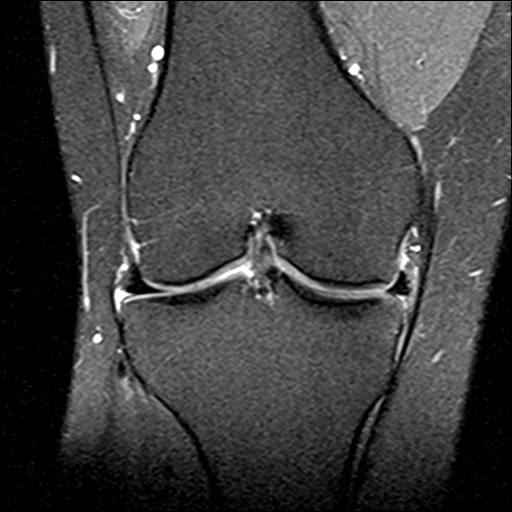
[im 21/32]
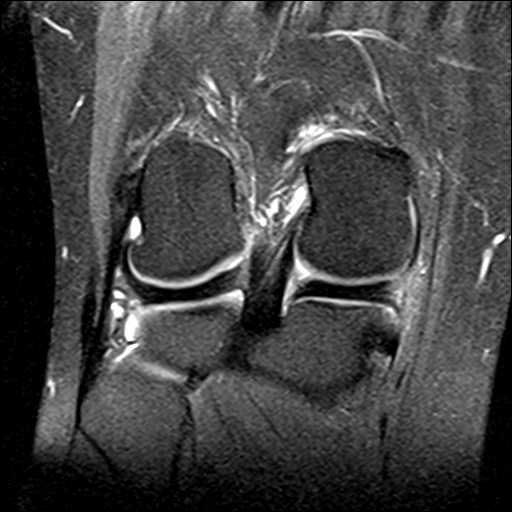
[im 26/32]
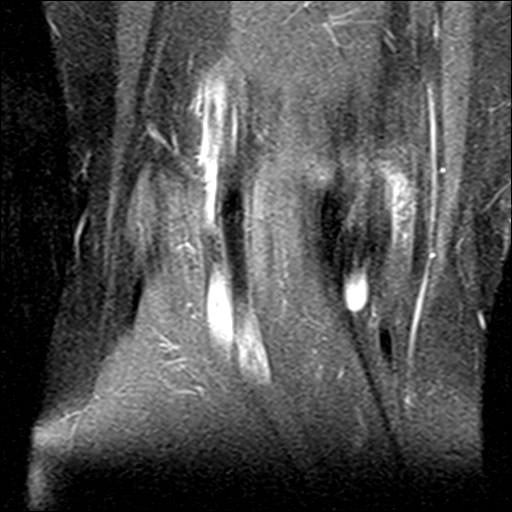
[im 32/32]
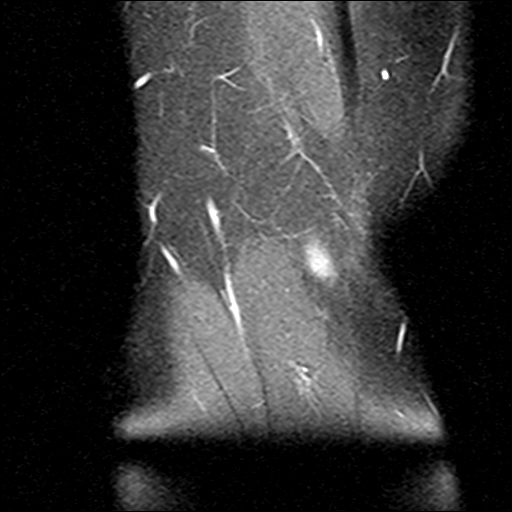

[Series 7: PD fat-sat · sagittal · 3.0mm · 0.29mm/px · 6 of 30 slices shown (2 of 3)]
[im 1/30]
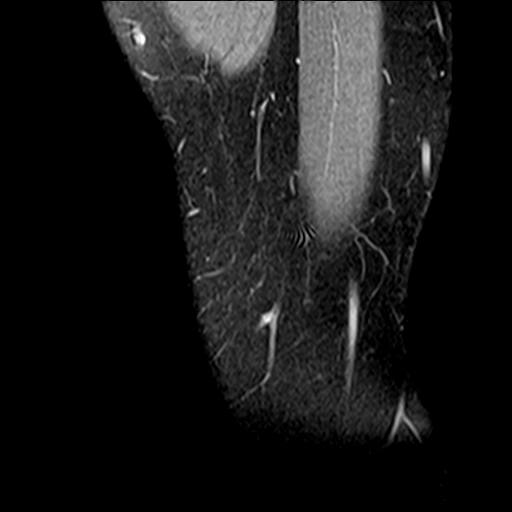
[im 6/30]
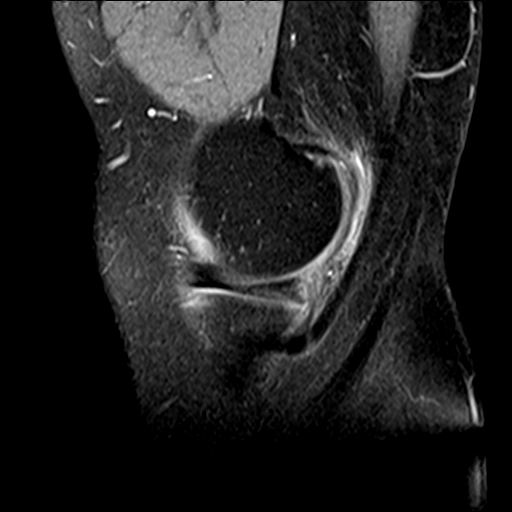
[im 12/30]
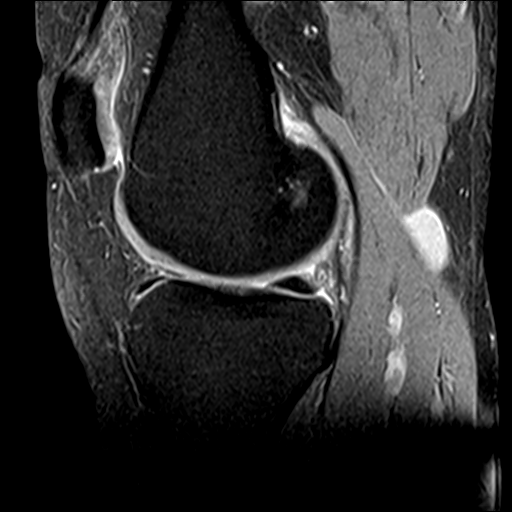
[im 18/30]
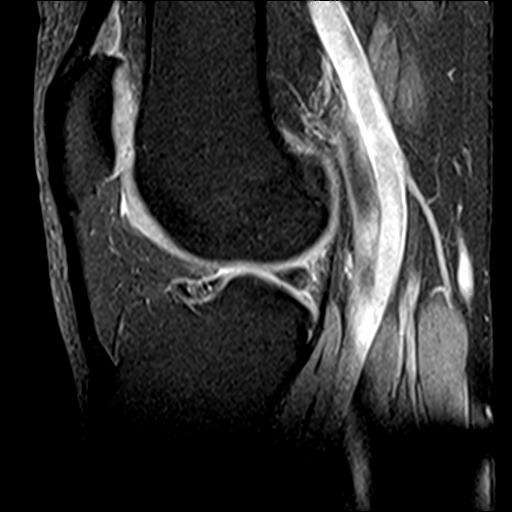
[im 24/30]
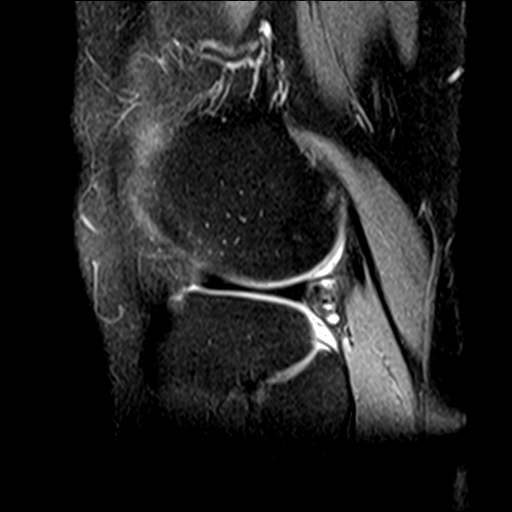
[im 30/30]
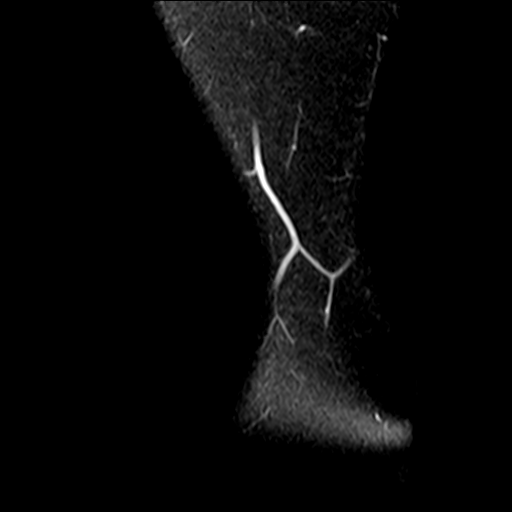

[Series 9: PD fat-sat · oblique · 2.0mm · 0.29mm/px · 2 of 11 slices shown (3 of 3)]
[im 1/11]
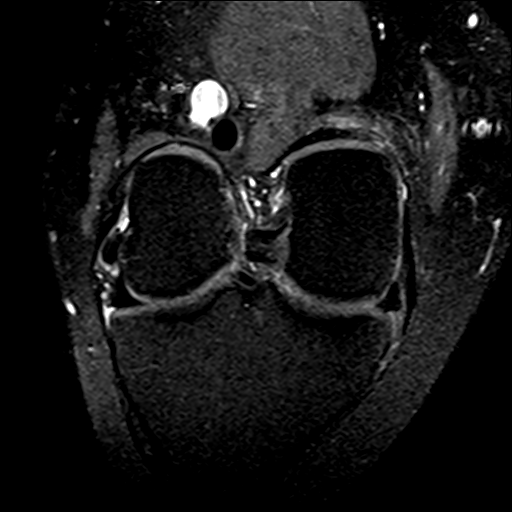
[im 11/11]
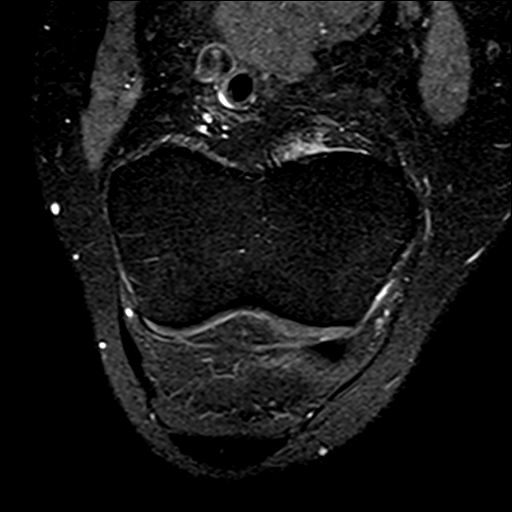

[19 of 40 positions shown; findings below may reference images not displayed]

FINDINGS: MENISCI

Medial meniscus: A very small free edge tear is observed in the
posterior horn for example on image 22 series 7 and image 18 series
3.

Lateral meniscus:  Unremarkable

LIGAMENTS

Cruciates:  Unremarkable

Collaterals:  Unremarkable

CARTILAGE

Patellofemoral: Minimal chondral heterogeneity along the medial
patellar facet.

Medial:  Unremarkable

Lateral:  Unremarkable

Joint: Thin medial plica. Mild focal synovitis just above the
patella and behind the quadriceps tendon on images 1-3 of series 3.

Popliteal Fossa: Baker's cyst 2.7 by 1.2 by 3.0 cm (volume =
cm^3). Mild semimembranosus-tibial collateral ligament bursitis.

Extensor Mechanism:  Unremarkable

Bones: No significant extra-articular osseous abnormalities
identified.

Other: No supplemental non-categorized findings.
IMPRESSION: 1. Very small free edge tear in the posterior horn of the medial
meniscus.
2. Mild focal synovitis just above the patella and behind the
quadriceps tendon.
3. Baker's cyst with mild semimembranosus-tibial collateral ligament
bursitis.
4. Minimal chondral heterogeneity along the medial patellar facet.

## 2022-03-08 ENCOUNTER — Other Ambulatory Visit (HOSPITAL_COMMUNITY): Payer: Self-pay

## 2022-03-16 ENCOUNTER — Other Ambulatory Visit (HOSPITAL_COMMUNITY): Payer: Self-pay

## 2022-04-26 DIAGNOSIS — Z1211 Encounter for screening for malignant neoplasm of colon: Secondary | ICD-10-CM | POA: Diagnosis not present

## 2022-04-26 LAB — HM COLONOSCOPY

## 2022-05-03 ENCOUNTER — Encounter: Payer: Self-pay | Admitting: Infectious Diseases

## 2022-09-03 ENCOUNTER — Encounter: Payer: Self-pay | Admitting: *Deleted

## 2022-09-25 ENCOUNTER — Other Ambulatory Visit: Payer: Self-pay

## 2022-09-27 ENCOUNTER — Other Ambulatory Visit (HOSPITAL_COMMUNITY): Payer: Self-pay

## 2022-11-22 ENCOUNTER — Encounter: Payer: Self-pay | Admitting: *Deleted

## 2023-01-18 ENCOUNTER — Other Ambulatory Visit (HOSPITAL_COMMUNITY): Payer: Self-pay

## 2023-01-18 ENCOUNTER — Ambulatory Visit (INDEPENDENT_AMBULATORY_CARE_PROVIDER_SITE_OTHER): Payer: 59 | Admitting: Family Medicine

## 2023-01-18 ENCOUNTER — Encounter: Payer: Self-pay | Admitting: Family Medicine

## 2023-01-18 VITALS — BP 124/80 | HR 69 | Temp 97.9°F | Ht 73.0 in | Wt 229.2 lb

## 2023-01-18 DIAGNOSIS — Z0000 Encounter for general adult medical examination without abnormal findings: Secondary | ICD-10-CM | POA: Diagnosis not present

## 2023-01-18 MED ORDER — LORAZEPAM 1 MG PO TABS
1.0000 mg | ORAL_TABLET | Freq: Three times a day (TID) | ORAL | 0 refills | Status: DC | PRN
  Filled 2023-01-18: qty 20, 7d supply, fill #0

## 2023-01-18 NOTE — Patient Instructions (Addendum)
Send doctors day labs to [place]  Recommended follow up: Return in about 1 year (around 01/19/2024) for physical or sooner if needed.Schedule b4 you leave.

## 2023-01-18 NOTE — Progress Notes (Signed)
Phone: [phone]   Subjective:  Patient presents today for their annual physical. Chief complaint-noted.   See problem oriented charting- ROS- full  review of systems was completed and negative  Per full ROS sheet completed by patient -knee and ankle better and back to full tennis  The following were reviewed and entered/updated in epic: Past Medical History:  Diagnosis Date   Back pain    Back pain about once a year- easy for a week or two then goes away. Since HS.    Hepatitis A    age 89   Hyperlipidemia    LDL low 100s   Patient Active Problem List   Diagnosis Date Noted   Hyperlipidemia 01/07/2022    Priority: Medium    Anxiety with flying 04/04/2017    Priority: Medium    Medial meniscus, posterior horn derangement, right 10/25/2020    Priority: Low   Patellofemoral stress syndrome of right knee 04/10/2016    Priority: Low   Bloating 04/04/2017   Past Surgical History:  Procedure Laterality Date   APPENDECTOMY  1990    Family History  Problem Relation Age of Onset   Prostate cancer Father        38 treated radiation gleason 3+3   Hyperlipidemia Father        no CAD in family   Hypertension Mother    Colon cancer Maternal Grandfather        age 67    Medications- reviewed and updated Current Outpatient Medications  Medication Sig Dispense Refill   LORazepam (ATIVAN) 1 MG tablet Take 1 tablet by mouth every 8 (eight) hours as needed for anxiety (for flights). 20 tablet 0   No current facility-administered medications for this visit.    Allergies-reviewed and updated No Known Allergies  Social History   Social History Narrative   Family: Married to [employer]. Daughter Percell Locus '06- going to 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       Hobbies: tennis   Objective  Objective:  BP 124/80   Pulse 69   Temp 97.9 F (36.6 C)   Ht 6' 1"$  (1.854 m)   Wt 229 lb 3.2 oz (104 kg)    SpO2 100%   BMI 30.24 kg/m  Gen: NAD, resting comfortably HEENT: Mucous membranes are moist. Oropharynx normal Neck: no thyromegaly CV: RRR no murmurs rubs or gallops Lungs: CTAB no crackles, wheeze, rhonchi Abdomen: soft/nontender/nondistended/normal bowel sounds. No rebound or guarding.  Ext: no edema Skin: warm, dry Neuro: grossly normal, moves all extremities, PERRLA   Assessment and Plan  47 y.o. male presenting for annual physical.  Health Maintenance counseling: 1. Anticipatory guidance: Patient counseled regarding regular dental exams -q4 months, eye exams -Dr. Brigitte Pulse told him only as needed follow up -readers only,  avoiding smoking and second hand smoke, limiting alcohol to 2 beverages per day - rare social every other week, no illicit drugs .   2. Risk factor reduction:  Advised patient of need for regular exercise and diet rich and fruits and vegetables to reduce risk of heart attack and stroke.  Exercise- back to tennis- 1 day a week and weights when on his 7 days on. Feels could improve on consistency Diet/weight management-wt stable- but states down 7 lbs from his peak- had gotten up to 236. Snacking in hospital hard- still biggest barrier.  Wt Readings from Last 3 Encounters:  01/18/23  229 lb 3.2 oz (104 kg)  01/08/22 229 lb 9.6 oz (104.1 kg)  01/12/21 228 lb (103.4 kg)  3. Immunizations/screenings/ancillary studies- covid had February 2023- holding off until newest vaccine  Immunization History  Administered Date(s) Administered   Influenza,inj,Quad PF,6+ Mos 09/02/2016, 09/25/2021, 09/23/2022   Influenza-Unspecified 09/09/2020   MMR 06/02/2002   PFIZER(Purple Top)SARS-COV-2 Vaccination 12/17/2018, 11/27/2019, 09/01/2020, 06/20/2021   Pfizer Covid-19 Vaccine Bivalent Booster 44yr & up 09/27/2021   Tdap 08/15/2007, 06/26/2018   4. Prostate cancer screening- father with prostate cancer but in 757s(has done well)-we have trended PSA mainly with Dr. DElana Almlabs or here- will  get in march and send me a copy with hospital Lab Results  Component Value Date   PSA 0.21 02/12/2022   PSA 0.5 03/15/2021   PSA 0.39 06/29/2019   5. Colon cancer screening - 04/26/2022 with 10-year repeat with Dr. HBenson Norway6. Skin cancer screening- advised regular sunscreen use. Denies worrisome, changing, or new skin lesions. Tries to wear hat 7. Smoking associated screening (lung cancer screening, AAA screen 65-75, UA)-former smoker- 7 pack years but quit in 2006.  Opts out of UA  8. STD screening - only active with wife  Status of chronic or acute concerns   #hyperlipidemia-CT cardiac scoring of 0 on February 01, 2021 #Aortic atherosclerosis-from CT cardiac scoring "Scant calcifications in ascending aorta." No family CV history.  S: Medication:None -Lipoprotein a not elevated Lab Results  Component Value Date   CHOL 198 02/12/2022   HDL 40.00 02/12/2022   LDLCALC 138 (H) 02/12/2022   TRIG 98.0 02/12/2022   CHOLHDL 5 02/12/2022   A/P: lipids mildly elevated- will update with doctors day labs and we can reevaluate at that point- unlikely to recommend statin unless worsening ct calcium scoring in future particularly with reassuring family history   #Ascending Aorta:-From CT cardiac scoring "mildly dilated at the bifurcation of the main pulmonary artery at 474m"  -We discussed using caution with using quinolones in the future  -We discussed options for follow-up-he prefers to recheck with every 5 year CT calcium scoring  #Anxiety with flying-patient has done hypnotherapy in the past with some relief.  Still requires Ativan for  Ro[place]is and [place]un[place]ips.    Recommended follow up: Return in about 1 year (around 01/19/2024) for physical or sooner if needed.Schedule b4 you leave.  Lab/Order associations: will get later fasting labs with doctors day   ICD-10-CM   1. Preventative health care  Z00.00       Meds ordered this encounter  Medications   LORazepam (ATIVAN) 1 MG tablet    Sig: Take 1  tablet by mouth every 8 (eight) hours as needed for anxiety (for flights).    Dispense:  20 tablet    Refill:  0    Return precautions advised.  StGarret ReddishMD

## 2023-03-15 LAB — COMPREHENSIVE METABOLIC PANEL
Albumin: 4.2 (ref 3.5–5.0)
Calcium: 9.2 (ref 8.7–10.7)
Globulin: 2.4
eGFR: 105

## 2023-03-15 LAB — BASIC METABOLIC PANEL
BUN: 12 (ref 4–21)
CO2: 24 — AB (ref 13–22)
Chloride: 104 (ref 99–108)
Creatinine: 0.9 (ref 0.6–1.3)
Glucose: 101
Potassium: 4.3 mEq/L (ref 3.5–5.1)
Sodium: 140 (ref 137–147)

## 2023-03-15 LAB — LIPID PANEL
Cholesterol: 190 (ref 0–200)
HDL: 36 (ref 35–70)
LDL Cholesterol: 140
Triglycerides: 78 (ref 40–160)

## 2023-03-15 LAB — TSH: TSH: 2.14 (ref 0.41–5.90)

## 2023-03-15 LAB — HEPATIC FUNCTION PANEL
ALT: 19 U/L (ref 10–40)
AST: 24 (ref 14–40)

## 2023-03-15 LAB — PSA: PSA: 0.3

## 2023-03-20 ENCOUNTER — Encounter: Payer: Self-pay | Admitting: Family Medicine

## 2023-03-21 NOTE — Telephone Encounter (Signed)
See below, labs have been abstracted.

## 2023-07-29 ENCOUNTER — Other Ambulatory Visit: Payer: Self-pay | Admitting: Oncology

## 2023-07-29 DIAGNOSIS — Z006 Encounter for examination for normal comparison and control in clinical research program: Secondary | ICD-10-CM

## 2023-08-06 ENCOUNTER — Other Ambulatory Visit: Payer: 59

## 2023-08-06 DIAGNOSIS — Z1379 Encounter for other screening for genetic and chromosomal anomalies: Secondary | ICD-10-CM

## 2023-08-06 DIAGNOSIS — Z006 Encounter for examination for normal comparison and control in clinical research program: Secondary | ICD-10-CM

## 2023-08-13 LAB — GENECONNECT MOLECULAR SCREEN: Genetic Analysis Overall Interpretation: NEGATIVE

## 2024-01-21 ENCOUNTER — Ambulatory Visit (INDEPENDENT_AMBULATORY_CARE_PROVIDER_SITE_OTHER): Payer: 59 | Admitting: Family Medicine

## 2024-01-21 ENCOUNTER — Encounter: Payer: Self-pay | Admitting: Family Medicine

## 2024-01-21 ENCOUNTER — Other Ambulatory Visit (HOSPITAL_COMMUNITY): Payer: Self-pay

## 2024-01-21 VITALS — BP 102/60 | HR 74 | Temp 98.6°F | Ht 73.0 in | Wt 213.8 lb

## 2024-01-21 DIAGNOSIS — Z13220 Encounter for screening for lipoid disorders: Secondary | ICD-10-CM

## 2024-01-21 DIAGNOSIS — Z0000 Encounter for general adult medical examination without abnormal findings: Secondary | ICD-10-CM

## 2024-01-21 DIAGNOSIS — Z130 Encounter for screening for diseases of the blood and blood-forming organs and certain disorders involving the immune mechanism: Secondary | ICD-10-CM | POA: Diagnosis not present

## 2024-01-21 DIAGNOSIS — Z789 Other specified health status: Secondary | ICD-10-CM | POA: Diagnosis not present

## 2024-01-21 DIAGNOSIS — Z131 Encounter for screening for diabetes mellitus: Secondary | ICD-10-CM | POA: Diagnosis not present

## 2024-01-21 DIAGNOSIS — E663 Overweight: Secondary | ICD-10-CM

## 2024-01-21 DIAGNOSIS — Z125 Encounter for screening for malignant neoplasm of prostate: Secondary | ICD-10-CM | POA: Diagnosis not present

## 2024-01-21 LAB — CBC
HCT: 40.9 % (ref 39.0–52.0)
Hemoglobin: 13.5 g/dL (ref 13.0–17.0)
MCHC: 33.1 g/dL (ref 30.0–36.0)
MCV: 94.1 fL (ref 78.0–100.0)
Platelets: 193 10*3/uL (ref 150.0–400.0)
RBC: 4.35 Mil/uL (ref 4.22–5.81)
RDW: 12.6 % (ref 11.5–15.5)
WBC: 4 10*3/uL (ref 4.0–10.5)

## 2024-01-21 LAB — COMPREHENSIVE METABOLIC PANEL
ALT: 18 U/L (ref 0–53)
AST: 18 U/L (ref 0–37)
Albumin: 4.4 g/dL (ref 3.5–5.2)
Alkaline Phosphatase: 50 U/L (ref 39–117)
BUN: 9 mg/dL (ref 6–23)
CO2: 29 meq/L (ref 19–32)
Calcium: 9.4 mg/dL (ref 8.4–10.5)
Chloride: 104 meq/L (ref 96–112)
Creatinine, Ser: 0.93 mg/dL (ref 0.40–1.50)
GFR: 97.17 mL/min (ref >60.00)
Glucose, Bld: 100 mg/dL — ABNORMAL HIGH (ref 70–99)
Potassium: 4.6 meq/L (ref 3.5–5.1)
Sodium: 139 meq/L (ref 135–145)
Total Bilirubin: 0.5 mg/dL (ref 0.2–1.2)
Total Protein: 6.9 g/dL (ref 6.0–8.3)

## 2024-01-21 LAB — LIPID PANEL
Cholesterol: 175 mg/dL (ref 0–200)
HDL: 34 mg/dL — ABNORMAL LOW (ref >39.00)
LDL Cholesterol: 126 mg/dL — ABNORMAL HIGH (ref 0–99)
NonHDL: 141.34
Total CHOL/HDL Ratio: 5
Triglycerides: 77 mg/dL (ref 0.0–149.0)
VLDL: 15.4 mg/dL (ref 0.0–40.0)

## 2024-01-21 LAB — VITAMIN D 25 HYDROXY (VIT D DEFICIENCY, FRACTURES): VITD: 49.07 ng/mL (ref 30.00–100.00)

## 2024-01-21 LAB — HEMOGLOBIN A1C: Hgb A1c MFr Bld: 5.3 % (ref 4.6–6.5)

## 2024-01-21 LAB — VITAMIN B12: Vitamin B-12: 543 pg/mL (ref 211–911)

## 2024-01-21 LAB — PSA: PSA: 0.36 ng/mL (ref 0.10–4.00)

## 2024-01-21 MED ORDER — LORAZEPAM 1 MG PO TABS
1.0000 mg | ORAL_TABLET | Freq: Three times a day (TID) | ORAL | 0 refills | Status: AC | PRN
  Filled 2024-01-21: qty 20, 7d supply, fill #0

## 2024-01-21 NOTE — Patient Instructions (Addendum)
Please stop by lab before you go If you have mychart- we will send your results within 3 business days of [place] receiving them.  If you do not have mychart- we will call you about results within 5 business days of [place] receiving them.  *please also note that you will see labs on mychart as soon as they post. I will later go in and write notes on them- will say "notes from Dr. Durene Cal"   Recommended follow up: Return in about 1 year (around 01/20/2025) for physical or sooner if needed.Schedule b4 you leave.

## 2024-01-21 NOTE — Progress Notes (Signed)
 Phone: [phone]    Subjective:  Patient presents today for their annual physical. Chief complaint-noted.   See problem oriented charting- ROS- full  review of systems was completed and negative  Per full ROS sheet completed by patient  The following were reviewed and entered/updated in epic: Past Medical History:  Diagnosis Date   Back pain    Back pain about once a year- easy for a week or two then goes away. Since HS.    Hepatitis A    age 48   Hyperlipidemia    LDL low 100s   Patient Active Problem List   Diagnosis Date Noted   Hyperlipidemia 01/07/2022    Priority: Medium    Anxiety with flying 04/04/2017    Priority: Medium    Medial meniscus, posterior horn derangement, right 10/25/2020    Priority: Low   Patellofemoral stress syndrome of right knee 04/10/2016    Priority: Low   Bloating 04/04/2017   Past Surgical History:  Procedure Laterality Date   APPENDECTOMY  1990    Family History  Problem Relation Age of Onset   Prostate cancer Father        74 treated radiation gleason 3+3   Hyperlipidemia Father        no CAD in family   Hypertension Mother    Colon cancer Maternal Grandfather        age 88    Medications- reviewed and updated Current Outpatient Medications  Medication Sig Dispense Refill   LORazepam (ATIVAN) 1 MG tablet Take 1 tablet by mouth every 8 (eight) hours as needed for anxiety (for flights). 20 tablet 0   No current facility-administered medications for this visit.    Allergies-reviewed and updated No Known Allergies  Social History   Social History Narrative   Family: Married to [employer]. Daughter Osborne Casco '06- going to [employer] fall 2024      Work: Hospitalist at [employer]   MD in [place]. PHd [employer]. [employer] residency IM.       Hobbies: tennis      Objective:  BP 102/60   Pulse 74   Temp 98.6 F (37 C)   Ht 6\' 1"  (1.854 m)   Wt 213 lb 12.8 oz (97 kg)   SpO2 98%   BMI 28.21 kg/m  Gen: NAD, resting  comfortably HEENT: Mucous membranes are moist. Oropharynx normal Neck: no thyromegaly CV: RRR no murmurs rubs or gallops Lungs: CTAB no crackles, wheeze, rhonchi Abdomen: soft/nontender/nondistended/normal bowel sounds. No rebound or guarding.  Ext: no edema Skin: warm, dry Neuro: grossly normal, moves all extremities, PERRLA Declines genitourinary or rectal concerns    Assessment and Plan:  48 y.o. male presenting for annual physical.  Health Maintenance counseling: 1. Anticipatory guidance: Patient counseled regarding regular dental exams -q4 months, eye exams-has seen Dr. Clelia Croft in the past and was told only as needed follow-up-he only uses readers,  avoiding smoking and second hand smoke , limiting alcohol to 2 beverages per day-rare social perhaps every other week last year but went down to 0, no illicit drugs.   2. Risk factor reduction:  Advised patient of need for regular exercise and diet rich and fruits and vegetables to reduce risk of heart attack and stroke.  Exercise-last year was back to tennis 1 day a week and doing some weights-currently doing about the same- some running and tennis currently, fewer weights.  Diet/weight management-Down 16 pounds from last year! Went back to vegan diet for most part- breaks only  maybe once a month. With vegan diet check vitamin D and B12- rarely takes supplement Wt Readings from Last 3 Encounters:  01/21/24 213 lb 12.8 oz (97 kg)  01/18/23 229 lb 3.2 oz (104 kg)  01/08/22 229 lb 9.6 oz (104.1 kg)  a3. Immunizations/screenings/ancillary studies-declines COVID-19 vaccination but otherwise up-to-date Immunization History  Administered Date(s) Administered   Influenza,inj,Quad PF,6+ Mos 09/02/2016, 09/25/2021, 09/23/2022   Influenza-Unspecified 09/09/2020, 09/10/2023   MMR 06/02/2002   PFIZER(Purple Top)SARS-COV-2 Vaccination 12/17/2018, 11/27/2019, 09/01/2020, 06/20/2021   Pfizer Covid-19 Vaccine Bivalent Booster 62yrs & up 09/27/2021   Tdap  08/15/2007, 06/26/2018  4. Prostate cancer screening- father with prostate cancer in his 74s but ultimately did well-we have trended PSAs mainly with Dr. Morrie Sheldon labs in past- we will check today  Lab Results  Component Value Date   PSA 0.3 03/15/2023   PSA 0.21 02/12/2022   PSA 0.5 03/15/2021   5. Colon cancer screening - 04/26/2022 with 10-year repeat with Dr. Elnoria Howard 6. Skin cancer screening/prevention- considering seeing dermatology- wife would like for him to- gave som enames. advised regular sunscreen use. Denies worrisome, changing, or new skin lesions.  7. Testicular cancer screening- advised monthly self exams  8. STD screening- patient opts out as only active with wife 9. Smoking associated screening-former smoker- 7 pack years but quit in 2006.  No regular screening needed until age 45.  Has opted out of urinalysis in the past  Status of chronic or acute concerns   #hyperlipidemia-CT cardiac scoring of 0 on February 01, 2021 #Aortic atherosclerosis-from CT cardiac scoring "Scant calcifications in ascending aorta." S: Medication:None -Lipoprotein a not elevated Lab Results  Component Value Date   CHOL 190 03/15/2023   HDL 36 03/15/2023   LDLCALC 140 03/15/2023   TRIG 78 03/15/2023   CHOLHDL 5 02/12/2022   A/P: back on vegan diet- interested to see if #s look   #Ascending Aorta:-From CT cardiac scoring "mildly dilated at the bifurcation of the main pulmonary artery at 40mm."  -We discussed using caution with using quinolones in the future  -We discussed options for follow-up-he prefers to recheck with every 5 year CT calcium scoring  #Elevated blood pressure readings-looks really good back on vegan diet   #Anxiety with flying-patient has done hypnotherapy in the past with some relief.  Still requires Ativan for  [place] trips- and has upcoming [place] trip so needs refill today  plus will go to [place]  Recommended follow up: Return in about 1 year (around 01/20/2025) for physical  or sooner if needed.Schedule b4 you leave.  Lab/Order associations: fasting   ICD-10-CM   1. Preventative health care  Z00.00     2. Screening for hyperlipidemia  Z13.220 Comprehensive metabolic panel    Lipid panel    3. Screening for deficiency anemia  Z13.0 CBC    4. Screening for diabetes mellitus  Z13.1 Hemoglobin A1c    5. Overweight  E66.3 Hemoglobin A1c    6. Screening for prostate cancer  Z12.5 PSA    7. Vegan diet  Z78.9 Vitamin B12    VITAMIN D 25 Hydroxy (Vit-D Deficiency, Fractures)      Meds ordered this encounter  Medications   LORazepam (ATIVAN) 1 MG tablet    Sig: Take 1 tablet by mouth every 8 (eight) hours as needed for anxiety (for flights).    Dispense:  20 tablet    Refill:  0    Return precautions advised.   Tana Conch, MD

## 2024-07-29 DIAGNOSIS — H5203 Hypermetropia, bilateral: Secondary | ICD-10-CM | POA: Diagnosis not present

## 2024-07-29 DIAGNOSIS — H524 Presbyopia: Secondary | ICD-10-CM | POA: Diagnosis not present

## 2024-07-29 DIAGNOSIS — H31021 Solar retinopathy, right eye: Secondary | ICD-10-CM | POA: Diagnosis not present

## 2024-07-29 DIAGNOSIS — H52223 Regular astigmatism, bilateral: Secondary | ICD-10-CM | POA: Diagnosis not present

## 2024-08-27 DIAGNOSIS — L821 Other seborrheic keratosis: Secondary | ICD-10-CM | POA: Diagnosis not present

## 2024-08-27 DIAGNOSIS — L814 Other melanin hyperpigmentation: Secondary | ICD-10-CM | POA: Diagnosis not present

## 2024-09-23 ENCOUNTER — Ambulatory Visit (INDEPENDENT_AMBULATORY_CARE_PROVIDER_SITE_OTHER): Admitting: Family Medicine

## 2024-09-23 ENCOUNTER — Encounter: Payer: Self-pay | Admitting: Family Medicine

## 2024-09-23 ENCOUNTER — Other Ambulatory Visit: Payer: Self-pay

## 2024-09-23 VITALS — BP 108/78 | Ht 74.0 in | Wt 212.0 lb

## 2024-09-23 DIAGNOSIS — M25562 Pain in left knee: Secondary | ICD-10-CM | POA: Diagnosis not present

## 2024-09-23 NOTE — Progress Notes (Signed)
 PCP: Katrinka Garnette KIDD, MD  Discussed the use of AI scribe software for clinical note transcription with the patient, who gave verbal consent to proceed.  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  Left knee pain - Onset after playing tennis for four consecutive days in August - Pain localized to the medial aspect of the left knee - Initially caused difficulty with ambulation and stair navigation - Exacerbated by playing tennis, squatting, and twisting - Pain recurs with tennis activity; absent during periods of rest and work - No associated swelling, catching, or locking - Pain recurred after playing tennis this morning  Symptom relief and exacerbating factors - Temporary relief with rest, quadriceps exercises, biking, NSAIDs, and ice - Complete pain relief only achieved with two months of tennis abstinence - Pain relief occurs with rest; activity, particularly tennis, provokes symptoms  Past Medical History:  Diagnosis Date   Back pain    Back pain about once a year- easy for a week or two then goes away. Since HS.    Hepatitis A    age 47   Hyperlipidemia    LDL low 100s    Current Outpatient Medications on File Prior to Visit  Medication Sig Dispense Refill   LORazepam  (ATIVAN ) 1 MG tablet Take 1 tablet by mouth every 8 (eight) hours as needed for anxiety (for flights). 20 tablet 0   No current facility-administered medications on file prior to visit.    Past Surgical History:  Procedure Laterality Date   APPENDECTOMY  1990    No Known Allergies  BP 108/78   Ht 6' 2 (1.88 m)   Wt 212 lb (96.2 kg)   BMI 27.22 kg/m       No data to display              No data to display              Objective:  Physical Exam:  Gen: NAD, comfortable in exam room  Left knee: No gross deformity, ecchymoses, swelling. Mild TTP medial joint line and over pes  insertion. FROM with normal strength. Negative ant/post drawers. Negative valgus/varus testing. Negative lachman.  Mildly positive mcmurray, negative apley.  Positive thessalys NV intact distally.  Limited MSK u/s left knee: Trace effusion.  No joint space narrowing.  Radial medial meniscus tear visualized. Assessment & Plan Left medial knee pain with suspected medial meniscus injury, pes anserine tendinopathy Chronic left medial knee pain exacerbated by activity. Differential includes medial meniscus injury, pes anserine tendinopathy, and mild osteoarthritis. Ultrasound and exam most consistent with medial meniscus tear. - Home exercise program reviewed - aleve for 7 days then as needed. - Consider physical therapy, intraarticular injection if not improving as expected - follow up in 5-6 weeks.

## 2024-09-23 NOTE — Patient Instructions (Addendum)
 You have a small medial meniscus tear. Do home exercises daily focused on quad strengthening. Consider aleve 2 tabs twice a day with food for pain and inflammation - can take for 7 days regularly then as needed. Consider physical therapy if not improving as expected. Steroid injection doesn't fix the issue but if you're struggling with pain is an option to calm down the inflammation. Sports/activities as tolerated. Follow up with me in about 5-6 weeks.

## 2024-10-16 ENCOUNTER — Other Ambulatory Visit (HOSPITAL_COMMUNITY): Payer: Self-pay

## 2024-11-09 ENCOUNTER — Ambulatory Visit (INDEPENDENT_AMBULATORY_CARE_PROVIDER_SITE_OTHER): Admitting: Family Medicine

## 2024-11-09 VITALS — BP 120/84 | Ht 74.0 in | Wt 212.0 lb

## 2024-11-09 DIAGNOSIS — M25562 Pain in left knee: Secondary | ICD-10-CM | POA: Diagnosis not present

## 2024-11-09 NOTE — Progress Notes (Signed)
 PCP: Katrinka Garnette KIDD, MD  Patient is a 48 y.o. male here for left knee pain with prior concern for small medial meniscus tear.  HPI Patient was last seen on 10/15 with concern for small medial meniscus tear. Patient reports that he contines to play tennis, though he has slightly reduced frequency of play. He states he is comfortable playing through discomfort, though he is cautious about pushing himself to the limit. Feels pain with full squat, also triggered by playing tennis.  Patient sometimes uses his [employer] phone app for PT, though he reports inconsistently. He is not taking any meds, reports pain is well-controlled. Occasionally uses Voltaren gel and ice pack, has not needed for past few weeks.  Past Medical History:  Diagnosis Date   Back pain    Back pain about once a year- easy for a week or two then goes away. Since HS.    Hepatitis A    age 42   Hyperlipidemia    LDL low 100s    Current Outpatient Medications on File Prior to Visit  Medication Sig Dispense Refill   LORazepam  (ATIVAN ) 1 MG tablet Take 1 tablet by mouth every 8 (eight) hours as needed for anxiety (for flights). 20 tablet 0   No current facility-administered medications on file prior to visit.    Past Surgical History:  Procedure Laterality Date   APPENDECTOMY  1990    No Known Allergies  BP 120/84   Ht 6' 2 (1.88 m)   Wt 212 lb (96.2 kg)   BMI 27.22 kg/m       No data to display              No data to display              Objective:  Physical Exam:  Gen: NAD, comfortable in exam room  Location: Left knee (compared to right) - Inspection: No visible edema, erythema, or overlying skin findings. No effusion - Palpation: Mild TTP directly over left medial joint line, no pain along pes anserine insertion - ROM: Full knee extension bilaterally - Strength: 5/5 bilateral knee extension and flexion - Special Tests: Negative anterior and posterior drawer sign, negative  Lachman's, negative Apley's, negative Thessaly's, negative McMurray's   Assessment and Plan:   Left medial knee pain, suspected medial meniscus injury is improving.  No evidence of pes anserine tendinopathy noted on exam today, was some concern last visit.  Knee joint reassuringly stable on exam.  Likely some component of chronic knee pain, possible early osteoarthritis.  Exacerbated by tennis play, but good patient is remaining active. - Reviewed home exercises, recommended daily - NSAIDs PRN, but not needing currently - Tennis as tolerated, but no specific limitations - Return if worsening or any significant change

## 2025-01-27 ENCOUNTER — Encounter: Payer: 59 | Admitting: Family Medicine
